# Patient Record
Sex: Female | Born: 1970 | Race: Asian | Hispanic: No | Marital: Married | Smoking: Never smoker
Health system: Southern US, Community
[De-identification: ages and names within clinical notes are randomized; demographics above are authoritative.]

## PROBLEM LIST (undated history)

## (undated) DIAGNOSIS — E119 Type 2 diabetes mellitus without complications: Secondary | ICD-10-CM

---

## 2000-10-30 ENCOUNTER — Other Ambulatory Visit: Admission: RE | Admit: 2000-10-30 | Discharge: 2000-10-30 | Payer: Self-pay | Admitting: Obstetrics and Gynecology

## 2001-04-23 ENCOUNTER — Inpatient Hospital Stay (HOSPITAL_COMMUNITY): Admission: AD | Admit: 2001-04-23 | Discharge: 2001-04-24 | Payer: Self-pay | Admitting: *Deleted

## 2004-08-01 ENCOUNTER — Other Ambulatory Visit: Admission: RE | Admit: 2004-08-01 | Discharge: 2004-08-01 | Payer: Self-pay | Admitting: Obstetrics and Gynecology

## 2004-08-29 ENCOUNTER — Encounter: Admission: RE | Admit: 2004-08-29 | Discharge: 2004-08-29 | Payer: Self-pay | Admitting: Obstetrics and Gynecology

## 2005-03-20 ENCOUNTER — Encounter: Admission: RE | Admit: 2005-03-20 | Discharge: 2005-04-08 | Payer: Self-pay | Admitting: Obstetrics and Gynecology

## 2005-04-11 ENCOUNTER — Ambulatory Visit (HOSPITAL_COMMUNITY): Admission: RE | Admit: 2005-04-11 | Discharge: 2005-04-11 | Payer: Self-pay | Admitting: Obstetrics and Gynecology

## 2005-04-14 ENCOUNTER — Inpatient Hospital Stay (HOSPITAL_COMMUNITY): Admission: AD | Admit: 2005-04-14 | Discharge: 2005-04-23 | Payer: Self-pay | Admitting: Obstetrics and Gynecology

## 2007-05-04 ENCOUNTER — Other Ambulatory Visit: Admission: RE | Admit: 2007-05-04 | Discharge: 2007-05-04 | Payer: Self-pay | Admitting: Family Medicine

## 2007-12-10 ENCOUNTER — Ambulatory Visit: Payer: Self-pay | Admitting: Family Medicine

## 2007-12-10 LAB — CONVERTED CEMR LAB
AST: 12 units/L (ref 0–37)
Alkaline Phosphatase: 74 units/L (ref 39–117)
Basophils Absolute: 0 10*3/uL (ref 0.0–0.1)
Basophils Relative: 0 % (ref 0–1)
CO2: 23 meq/L (ref 19–32)
Chloride: 107 meq/L (ref 96–112)
Creatinine, Ser: 0.48 mg/dL (ref 0.40–1.20)
Eosinophils Absolute: 0.2 10*3/uL (ref 0.0–0.7)
Glucose, Bld: 75 mg/dL (ref 70–99)
HCT: 37.2 % (ref 36.0–46.0)
Lymphocytes Relative: 27 % (ref 12–46)
Platelets: 269 10*3/uL (ref 150–400)
Potassium: 4 meq/L (ref 3.5–5.3)
RBC: 4.26 M/uL (ref 3.87–5.11)
Sed Rate: 30 mm/hr — ABNORMAL HIGH (ref 0–22)
Sodium: 140 meq/L (ref 135–145)
Total Bilirubin: 0.6 mg/dL (ref 0.3–1.2)
Vit D, 1,25-Dihydroxy: 16 — ABNORMAL LOW (ref 30–89)

## 2010-05-27 ENCOUNTER — Other Ambulatory Visit: Admission: RE | Admit: 2010-05-27 | Discharge: 2010-05-27 | Payer: Self-pay | Admitting: Family Medicine

## 2010-07-20 ENCOUNTER — Other Ambulatory Visit: Payer: Self-pay | Admitting: Family Medicine

## 2010-07-20 DIAGNOSIS — Z1231 Encounter for screening mammogram for malignant neoplasm of breast: Secondary | ICD-10-CM

## 2010-10-08 ENCOUNTER — Ambulatory Visit
Admission: RE | Admit: 2010-10-08 | Discharge: 2010-10-08 | Disposition: A | Discharge status: Home / Self Care | Payer: No Typology Code available for payment source | Source: Ambulatory Visit | Attending: Family Medicine | Admitting: Family Medicine

## 2010-10-08 DIAGNOSIS — Z1231 Encounter for screening mammogram for malignant neoplasm of breast: Secondary | ICD-10-CM

## 2010-11-15 NOTE — H&P (Signed)
[facility]  PatientSHAMERA, [name] Visit Number: [id number] MRN: [id number]          Service Type: Attending:  Naima A. Normand Sloop, M.D. Dictated by:   Nigel Bridgeman, C.N.M. Adm. Date:  04/23/01                           History and Physical  DATE OF BIRTH:                Apr 26, 1971  HISTORY OF PRESENT ILLNESS:   [name] is a 40 year old gravida 4, para 2-1-0-3 at 38-3/7 weeks who presents with uterine contractions since 6 a.m.  She denies any bleeding or leaking and reports positive fetal movement.  Pregnancy has been remarkable for:  1. History of rapid labor. 2. Language barrier with husband as Nurse, learning disability. 3. Previous preterm delivery at 36 weeks. 4. Conception while breast-feeding. 5. Skin rash of unknown etiology.  PRENATAL LABORATORY DATA:     Blood type is O+, Rh antibody negative, VDRL nonreactive, rubella titer positive, hepatitis B surface antigen negative, HIV nonreactive, sickle cell test negative, GC and chlamydia cultures were negative.  Pap was normal.  Initial glucose challenge was elevated at 140. Three-hour GTT had a three-hour value that was slightly elevated.  Hemoglobin upon entering the practice was 11.5.  It was 11.4 at 30 weeks.  EDC of May 03, 2001 was established by 25-week ultrasound secondary to conception on breast-feeding.  She had been scheduled for an ultrasound at 18 weeks but missed that appointment.  Plan had been made to repeat an ultrasound at 36 weeks; however, these results unknown.  HISTORY OF PRESENT PREGNANCY: The patient entered care at approximately 11 weeks.  TSH was done at her first visit secondary to hair loss.  She missed her 18-week ultrasound and had one at approximately 27 weeks, which showed questionable macrosomia.  Her one-hour Glucola was elevated.  Her three-hour GTT was slightly elevated on a three-hour value but no diagnosis of gestational diabetes was made.  She did have  some multiple skin lesions that were noted from the very beginning.  She was referred to Dr. Danella Deis.  PAST OBSTETRICAL HISTORY:     In 1995, she had a vaginal birth of a female infant, weight 7 pounds at 37 weeks.  She was in labor two hours.  This child was delivered in [place].  In 1997, she had a vaginal birth of a female infant, weight 7 pounds.  She was in labor approximately one hour.  She delivered at 38 weeks.  She had no complications.  This child was also delivered in [place].  In November 2000, she had a vaginal birth of a female infant, weight 6 pounds 8 ounces at 36 weeks.  She was in labor approximately one hour.  She had no complications.  That child was delivered in [place].  She did have severe nausea and vomiting with the previous pregnancy.  She had delivery at 36 weeks with her last pregnancy.  She also has occasional yeast infections during her pregnancies.  She reports the usual childhood illnesses and immunizations.  She does have occasional constipation.  Her only other hospitalization was for childbirth.  FAMILY HISTORY:               Unremarkable.  GENETIC HISTORY:              Unremarkable.  SOCIAL HISTORY:  The patient is married to the father of the baby.  He is involved and supportive.  His name is [employer].  The patient is of the Muslim faith.  She is from [place].  She speaks Urdu.  Her husband serves as an Equities trader.  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 Her husband has some college, Scientist, product/process development school, and is employed as an Retail banker.  She has been followed by the certified nurse midwife service at [facility].  She denies any alcohol, drug, or tobacco use during this pregnancy.  PHYSICAL EXAMINATION:  VITAL SIGNS:                  Stable.  The patient is afebrile.  HEENT:                        Within normal limits.  LUNGS:                        Breath sounds are clear.  HEART:                         Regular rate and rhythm without murmur.  BREASTS:                      Soft and nontender.  ABDOMEN:                      Fundal height is approximately 37 cm.  Estimated fetal weight is 7 to 7-1/2 pounds.  Uterine contractions are every 3-4 minutes, moderate quality.  Cervical exam 8 cm, 80%, vertex at a -1 station with bulging bag of water.  Fetal heart rate is reactive with no decelerations.  EXTREMITIES:                  Deep tendon reflexes are 2+ without clonus. There is a trace edema noted.  There are multiple scabbed lesions on her arms and legs.  IMPRESSION:                   1. Intrauterine pregnancy at 38-3/7 weeks.                               2. Active labor.                               3. History of rapid labor.                               4. Mild language barrier.                               5. Negative beta Strep.  PLAN:                         1. Admit to birthing suite for consult with                                  Dr. Jaymes Graff as attending physician.  2. Routine certified nurse midwife orders.                               3. Plan Stadol IV per patient request.                               4. Anticipate normal spontaneous vaginal birth. Dictated by:   Nigel Bridgeman, C.N.M. Attending:  Naima A. Normand Sloop, M.D. DD:  04/23/01 TD:  04/23/01 Job: [id number]

## 2010-11-15 NOTE — Discharge Summary (Signed)
[name], [name]                ACCOUNT NO.:  [id number]   MEDICAL RECORD NO.:  [id number]          PATIENT TYPE:  INP   LOCATION:  9122                          [facility]ILITY:  [facility]   PHYSICIAN:  Janine Limbo, M.D.DATE OF BIRTH:  1970-09-10   DATE OF ADMISSION:  04/14/2005  DATE OF DISCHARGE:  04/23/2005                                 DISCHARGE SUMMARY   ADMISSION DIAGNOSES:  1.  Intrauterine pregnancy at 34 weeks.  2.  Preterm premature rupture of membranes.  3.  Gestational diabetes.   DISCHARGE DIAGNOSES:  1.  Intrauterine pregnancy at 34 weeks.  2.  Preterm premature rupture of membranes.  3.  Gestational diabetes.  4.  Variable decelerations.  5.  Labor and vaginal delivery of a viable female infant named Darin Engels, Apgars      7 and 9 weighing 5 pounds 12 ounces.   HOSPITAL PROCEDURES:  1.  Electronic fetal monitoring.  2.  Diabetes management.  3.  Ultrasound.  4.  Betamethasone series.  5.  Epidural.  6.  Spontaneous vaginal delivery of a female infant named Ebrahim, Apgars 7      and 9 weighing 5 pounds 12 ounces.   HOSPITAL COURSE:  Patient was admitted with preterm premature rupture of  membranes at 34 weeks.  Expectant management was undertaken.  Cultures was  done.  Ultrasound was done showing a 34-week baby with an 8/8 BPP.  She was  placed on bed rest and diabetes management.  She did have some lability of  blood sugars related to steroid administration and this was controlled with  sliding scale insulin.  She developed some intermittent variable  decelerations on October 19 which resolved over the next few days.  She  continued to leak small amounts of clear fluid and then on October 23 she  went into labor and had a spontaneous vaginal delivery of a female infant  named Ebrahim with Apgars 7 and 9 weighing 5 pounds 12 ounces.  Baby was  well and was taken to the regular nursery.  Spontaneous expulsion of the  placenta was followed by an EBL of 300 mL and the  patient was taken to the  mother/baby unit where she did well except for some lability in her blood  sugars.  Fasting blood sugars have been 73-101 with two-hour p.c. blood  sugars ranging from 146-244.  Sliding scale insulin has been administered.  On postpartum day #2 she was doing well, ready to go home.  She was breast-  feeding her infant well and planning an IUD for contraception.  Vital signs  were stable, she was afebrile.  Capillary blood glucose fasting was 73.  Her  last two-hour p.c. blood sugar was 173.  She was treated with sliding scale  insulin.  Chest was clear.  Heart rate regular rate and rhythm.  Fundus was  firm.  Lochia small to moderate.  Perineum was healing.  Extremity within  normal limits and she was deemed to have received the full benefit of her  hospital stay, was discharged home.   DISCHARGE MEDICATIONS:  1.  Motrin 600 mg p.o. q.6 h. p.r.n.  2.  Insulin per sliding scale per M.D.   DISCHARGE LABORATORIES:  White blood cell count 13.6, hemoglobin 11.8,  platelets 306.   DISCHARGE INSTRUCTIONS:  Per [facility] handout and insulin management will be  determined by physician prior to discharge.   DISCHARGE FOLLOW-UP:  In four weeks for cultures and then two weeks later  for IUD.      [name], C.N.M.      Janine Limbo, M.D.  Electronically Signed    MLW/MEDQ  D:  04/23/2005  T:  04/23/2005  Job:  [id number]

## 2010-11-15 NOTE — H&P (Signed)
NAMEDHRUTI, GHUMAN                ACCOUNT NO.:  [id number]   MEDICAL RECORD NO.:  [id number]          PATIENT TYPE:  INP   LOCATION:  9155                          [facility]  [facility]   PHYSICIAN:  Janine Limbo, M.D.DATE OF BIRTH:  Mar 15, 1971   DATE OF ADMISSION:  04/14/2005  DATE OF DISCHARGE:                                HISTORY & PHYSICAL   HISTORY OF PRESENT ILLNESS:  Mrs. Noviello is a 40 year old female, gravida 5,  para 4-0-0-4, at 34-0/7 weeks who presents with rupture of membranes at 10  p.m. tonight.  She denies contractions, denies bleeding. She has had some  back pain.  Her pregnancy has been followed by the [facility]  M.D. service and is remarkable for :  1.  Gestational diabetes mellitus.  2.  Fetal left pyelectasia.  3.  Prefers female M.D.   LABORATORY DATA:  Labs were collected on Nov 17, 2004.  Hemoglobin 11.9,  hematocrit 35, platelets 301,000, blood type O positive, antibody negative.  Sickle cell trait negative.  RPR nonreactive.  Rubella immune.  Hepatitis B  surface antigen negative. HIV nonreactive.  Diarrhea negative.  Chlamydia  negative.  Cystic fibrosis negative.  Quad screen from January 17, 2005 was  within normal limits.  One-hour Glucola from February 26, 2005 was elevated.  Three-hour glucose tolerance test from February 25, 2005 was elevated.   HISTORY OF PRESENT PREGNANCY:  The patient presented for care at [facility] on Nov 27, 2004 at 14-1/7 weeks.  Ultrasonography from December 24, 2004 showed growth consistent with previous dating confirming [facility] of  May 25, 2005.  There was bilateral renal pyelectasis noted.  The  patient declined quad screen at that point.  When she was 22 weeks, she  decided to have quad screen which was within normal limits.  The patient had  an elevated dextrose stick at [redacted] weeks gestation and, therefore, was  scheduled for three-hour glucose tolerance test which was consistent with  gestational diabetes.   Ultrasonography at [redacted] weeks gestation showed AFW at  the 50th to 75th percentile.  The patient was started on insulin 4 units  Regular in the morning and at night.  Ultrasonography on April 11, 2005  showed estimated fetal weight in the 75th to 90th percentile with normal  fluid.  Cervix 4 cm in thickness.  She was consistent with her antenatal  testing.  The rest of her prenatal care has been unremarkable.  Her   OB HISTORY:  She is gravida 5, para 4-0-0-4.  In November 1995, she had a  vaginal delivery of the female infant weighing 7 pounds at 37 weeks after  two hours in labor.  She had no anesthesia.  No complications.  That infant  was born in [place].  In January 1997 she had a vaginal delivery of a female  infant weighing 7 pounds at 38 weeks after a one-hour labor.  She had no  anesthesia  and no complications with that pregnancy or birth.  That infant  was also born in [place].  In November 2000, she had  a female infant at 36  weeks weighing 6 pounds and 7 ounces after a one-hour labor, born vaginally.  She had no complications.  That infant was born in [place] in October 2002.  She had a female infant vaginally weighing 6 pounds 12 ounces after a one-  hour labor.  She had no anesthesia, no complications.  That infant [facility] born  at [facility].   PAST MEDICAL HISTORY:  She has no medication allergies.  She experienced  menarche at the age of 71 with 28-30-day cycles lasting five days.  She has  history of yeast infection.  Reports having had the usual childhood  illnesses.  Only medical problem is constipation.   PAST SURGICAL HISTORY:  Negative.   GENETIC HISTORY:  Negative.   SOCIAL HISTORY:  The patient is married to the father of the baby.  His name  is Sirha.  He is involved and supportive.  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  The father of the baby has 15 years of  education and is employed full-time as an Retail banker.  They deny  any  alcohol, tobacco, or illicit drug use since the pregnancy.   PHYSICAL EXAMINATION:  VITAL SIGNS:  Stable.  She is afebrile.  HEENT:  Grossly within normal limits.  CHEST:  Clear to auscultation.  HEART:  Regular rate and rhythm.  ABDOMEN:  Gravid in contour.  Fundal height extending approximately 34 cm by  pubic symphysis.  Fetal heart rate is reactive and reassuring.  There are  rare contractions.  They are mild.  PELVIC:  Remarkable for copious clear fluid, positive Nitrazine, positive  for ferning.  Cervix appears closed.  Bedside ultrasound confirms vertex  presentation.  EXTREMITIES:  Normal.   LABORATORY DATA:  Clean catch urinalysis is negative.   ASSESSMENT:  1.  Intrauterine pregnancy at 34 weeks.  2.  Preterm premature rupture of membranes.   PLAN:  1.  Admit to antenatal unit.  2.  Group B strep, gonorrhea and Chlamydia cultures were collected and urine      culture was sent.  3.  Plan expectant management.      Cam Hai, C.N.M.      Janine Limbo, M.D.  Electronically Signed    KS/MEDQ  D:  04/15/2005  T:  04/15/2005  Job:  [id number]

## 2011-11-27 ENCOUNTER — Other Ambulatory Visit: Payer: Self-pay

## 2011-11-27 ENCOUNTER — Other Ambulatory Visit: Payer: Self-pay | Admitting: Obstetrics and Gynecology

## 2011-11-27 ENCOUNTER — Other Ambulatory Visit (HOSPITAL_COMMUNITY)
Admission: RE | Admit: 2011-11-27 | Discharge: 2011-11-27 | Disposition: A | Discharge status: Home / Self Care | Payer: Medicaid Other | Source: Ambulatory Visit | Attending: Obstetrics and Gynecology | Admitting: Obstetrics and Gynecology

## 2011-11-27 DIAGNOSIS — Z3480 Encounter for supervision of other normal pregnancy, unspecified trimester: Secondary | ICD-10-CM | POA: Insufficient documentation

## 2011-11-27 DIAGNOSIS — Z113 Encounter for screening for infections with a predominantly sexual mode of transmission: Secondary | ICD-10-CM | POA: Insufficient documentation

## 2011-11-27 DIAGNOSIS — Z124 Encounter for screening for malignant neoplasm of cervix: Secondary | ICD-10-CM | POA: Insufficient documentation

## 2011-12-01 ENCOUNTER — Other Ambulatory Visit: Payer: Self-pay | Admitting: Obstetrics and Gynecology

## 2011-12-01 ENCOUNTER — Ambulatory Visit (HOSPITAL_COMMUNITY)
Admission: RE | Admit: 2011-12-01 | Discharge: 2011-12-01 | Disposition: A | Discharge status: Home / Self Care | Payer: Medicaid Other | Source: Ambulatory Visit | Attending: Obstetrics and Gynecology | Admitting: Obstetrics and Gynecology

## 2011-12-01 ENCOUNTER — Encounter (HOSPITAL_COMMUNITY): Payer: Self-pay

## 2011-12-01 VITALS — BP 107/66 | HR 85 | Wt 119.0 lb

## 2011-12-01 DIAGNOSIS — O24919 Unspecified diabetes mellitus in pregnancy, unspecified trimester: Secondary | ICD-10-CM

## 2011-12-01 DIAGNOSIS — O09529 Supervision of elderly multigravida, unspecified trimester: Secondary | ICD-10-CM

## 2011-12-01 NOTE — Progress Notes (Signed)
MFM note  [name] is a 41 yo [id number] currently at 68 2/7 weeks by dates, EDD 26 November who is referred due to hx of type II diabetes on Insulin.  She reports that she was given the diagnosis of diabetes 2 years ago and denies any know end-organ disease.  She has not had a recent HbA1C value.  She was seen by an optometry about 3 months ago and has no evidence of retinopathy.  She denies thyroid abnormalities.  She is currently on Lantus 20 units BID and "sliding scale" Humolog at each meal.  In general, she takes 10 units with each meal.  She has a glucometer, but admits to checking her sugars infrequently.  Her glucometer reports a 14 day average of 210 and a 30 day average of 183.  POB hx: preterm SVD at 36 weeks x 1; Term SVD x 4 without complications  PMH - as above  PSH - neg  Social - ETOH neg  Tob - neg  Impression/ Plan: Type II diabetes on insulin - poorly compliant  Based on her infrequent fingerstick data, I am hesitant to make any changes to her current insulin regimen.  Fingerstick logs given to the patient.  Recommend checking fingersticks 4 x daily (fasting, 1 hr post prandial since on Humolog) Scheduled patient to see our diabetic education in 2 weeks- but will attempt to see her next week if possible.  Recommend: - Check HbA1C - this will assist in counseling regarding risks for fetal anomalies - 24 hr urine protein and Creat Cl if not already performed - Detailed MFM ultrasound at 18 weeks (tentatively scheduled) - Fetal echo at 20-22 weeks with Peds cardiology  Thank you for this referral.  I spent approximately 30 minutes with this patient with over 50% of time spent in face-to-face counseling.

## 2011-12-18 ENCOUNTER — Other Ambulatory Visit: Payer: Self-pay | Admitting: Obstetrics and Gynecology

## 2011-12-18 ENCOUNTER — Encounter: Payer: Medicaid Other | Attending: Obstetrics and Gynecology | Admitting: Dietician

## 2011-12-18 ENCOUNTER — Ambulatory Visit (HOSPITAL_COMMUNITY)
Admission: RE | Admit: 2011-12-18 | Discharge: 2011-12-18 | Disposition: A | Discharge status: Home / Self Care | Payer: Medicaid Other | Source: Ambulatory Visit | Attending: Obstetrics and Gynecology | Admitting: Obstetrics and Gynecology

## 2011-12-18 DIAGNOSIS — O99810 Abnormal glucose complicating pregnancy: Secondary | ICD-10-CM | POA: Insufficient documentation

## 2011-12-18 DIAGNOSIS — Z713 Dietary counseling and surveillance: Secondary | ICD-10-CM | POA: Insufficient documentation

## 2011-12-18 NOTE — ED Notes (Addendum)
Diabetes Education:  41 year old G6 P5 presents for review of the carb restricted diet in pregnancy.  Gives history of never having glucose issues until her last pregnancy in 2006 when she had GDM.  Following her delivery in 2006, she had no glucose issues until 2010, she was initially diagnosed with type 2 diabetes and found that oral medications did not control her glucose levels and she was started on Lantus and Novolog insulins.  Currently she is taking Lantus 20 units at HS and is using a sliding scale for her mealtime Novolog coverage.  Her glucose levels are consistently running 170-270 mg or greater.  She a couple fasting levels over the last 2 weeks that were 120 or less.  She had an episode on Tuesday where her glucose was 347 before lunch, she took the appropriate insulin dose pre her sliding scale and became sleepy, ate no lunch, layed down and slept for the afternoon, awoke at dinner time being shaky and sweaty with a glucose levels of 67 mg.  She has not been following a diet for limiting carbs.  She is eating 3 large meals each day and "stays hungry all the time."   We completed a review of the dietary recommendations.  She reported she could read [place] and I provided her with the handouts:Nutrition, Diabetes, and Pregnancy and a Carbohydrate Counting Guide.   She is now receiving [place] Medicaid, and has a True Tack glucose meter.  I provided her with an Accu-Chek SmartView Nano Meter Kit, and reviewed the testing procedure for this meter. Following review of glucose testing, a return demonstration produced a random late afternoon blood glucose of 267 (technically it would be a pre-dinner blood glucose.  Provided an Accu-Chek SmartView meter Kit, [id number] EXP: 03/29/2013. Given that many pharmacies do not have the strips and lancets for this model of Accu-Chek, Dr. Sherrie George provided prescriptions to cover strips and lancets for a new meter. She has my card and is to call or e-mail with questions.   She will return with her blood glucose readings when she returns for a follow-up appointment at [facility] on 6/26.  Maggie Cobi Delph, RN, RD, CDE

## 2011-12-25 ENCOUNTER — Other Ambulatory Visit: Payer: Self-pay

## 2011-12-25 ENCOUNTER — Encounter (HOSPITAL_COMMUNITY): Payer: Self-pay

## 2011-12-25 ENCOUNTER — Ambulatory Visit (HOSPITAL_COMMUNITY)
Admission: RE | Admit: 2011-12-25 | Discharge: 2011-12-25 | Disposition: A | Discharge status: Home / Self Care | Payer: Medicaid Other | Source: Ambulatory Visit | Attending: Obstetrics and Gynecology | Admitting: Obstetrics and Gynecology

## 2011-12-25 VITALS — BP 106/67 | HR 89 | Wt 123.0 lb

## 2011-12-25 DIAGNOSIS — Z363 Encounter for antenatal screening for malformations: Secondary | ICD-10-CM | POA: Insufficient documentation

## 2011-12-25 DIAGNOSIS — O358XX0 Maternal care for other (suspected) fetal abnormality and damage, not applicable or unspecified: Secondary | ICD-10-CM | POA: Insufficient documentation

## 2011-12-25 DIAGNOSIS — Z1389 Encounter for screening for other disorder: Secondary | ICD-10-CM | POA: Insufficient documentation

## 2011-12-25 DIAGNOSIS — IMO0002 Reserved for concepts with insufficient information to code with codable children: Secondary | ICD-10-CM

## 2011-12-25 DIAGNOSIS — O09529 Supervision of elderly multigravida, unspecified trimester: Secondary | ICD-10-CM | POA: Insufficient documentation

## 2011-12-25 DIAGNOSIS — Z8751 Personal history of pre-term labor: Secondary | ICD-10-CM | POA: Insufficient documentation

## 2011-12-25 DIAGNOSIS — O24919 Unspecified diabetes mellitus in pregnancy, unspecified trimester: Secondary | ICD-10-CM | POA: Insufficient documentation

## 2011-12-25 HISTORY — DX: Type 2 diabetes mellitus without complications: E11.9

## 2011-12-25 LAB — OB RESULTS CONSOLE HIV ANTIBODY (ROUTINE TESTING): HIV: NONREACTIVE

## 2011-12-25 LAB — OB RESULTS CONSOLE ABO/RH: RH Type: POSITIVE

## 2011-12-25 LAB — OB RESULTS CONSOLE RUBELLA ANTIBODY, IGM: Rubella: IMMUNE

## 2011-12-25 NOTE — Progress Notes (Signed)
Obstetric ultrasound performed today.   IUP at 18 weeks 1 day Fetal measurements consistent with dating by LMP Normal amniotic fluid volume Normal fetal anatomic survey (some limited cardiac views) No markers of fetal aneuploidy identified Marginal placental cord insertion  Advanced maternal age was discussed with the patient.  She stated a desire for genetic counseling but would like to return with her husband for this appointment.  She declined any testing for fetal aneuploidy today but stated that she may have questions to review at her follow up visit.   Patient's blood sugar log was reviewed today.  Most values were significantly elevated.  Patient was advised to increase her Lantus from 20 untis BID to 26 units BID.  She was also advised to check several  3AM blood sugars and to continue to keep a log.  A recent HgA1C was not able to be located today.  Questions were answered and precautions given.   Follow up visit with diabetes education scheduled 01/08/12.  Follow up ultrasound and genetic counseling scheduled in 4 weeks.  Patient declined a visit with genetic counseling sooner.  Fetal echo also scheduled.    Please see [facility] for full report.

## 2012-01-08 ENCOUNTER — Ambulatory Visit (HOSPITAL_COMMUNITY): Payer: Medicaid Other

## 2012-01-08 ENCOUNTER — Encounter: Payer: Medicaid Other | Attending: Obstetrics and Gynecology | Admitting: Dietician

## 2012-01-08 ENCOUNTER — Ambulatory Visit (HOSPITAL_COMMUNITY)
Admission: RE | Admit: 2012-01-08 | Discharge: 2012-01-08 | Disposition: A | Discharge status: Home / Self Care | Payer: Medicaid Other | Source: Ambulatory Visit | Attending: Obstetrics and Gynecology | Admitting: Obstetrics and Gynecology

## 2012-01-08 DIAGNOSIS — O99810 Abnormal glucose complicating pregnancy: Secondary | ICD-10-CM | POA: Insufficient documentation

## 2012-01-08 DIAGNOSIS — Z713 Dietary counseling and surveillance: Secondary | ICD-10-CM | POA: Insufficient documentation

## 2012-01-08 NOTE — ED Notes (Signed)
01/08/2012 Diabetes Education:  Comes into today and reports that she is not checking her blood glucose levels as instructed because the pharmacy would only give her one canister of monitoring strips.  The pharmacist told her her MD needed to write a prescription and state that she needed to monitor more frequently and why.  Medicaid will only pay for 1 canister of 50 strips per month.  She reports she was unable to get the MD in the [facility] office to write the prescription; so she has decreased her testing to 2 times per day.  She also was unable to get a prescription for the Lantus and Novolog written.  She has cut her insulin dose to "make it last"  She has prescribed Lantus 26 units in the evening and 26 units in the AM.  She currently is using 20 uints.  Her Novolog is by a sliding scale which she never has with her and does not remember.  Review of her blood glucose numbers in her meter reveal the lowest number at 119 and many in the 150-200-300-as high as 515 mg.  Today, she c/o thirst and took 2 12 oz cups of water during the visit.  She c/o burning and pain in her feet.  Her glucose at 4:50 PM was 253 mg. I was concerned, consulted with Dr. Claudean Severance and call and consulted with Dr. Chilton Si and Dr. Claudean Severance spoke with Dr. Chilton Si.  As I understand the situation, Dr Chilton Si plans to see her tomorrow, and to possibly admit [name] to the hospital to stabilize her blood glucose.  Maggie [place]ound Katich, RN, RD, CDE

## 2012-01-13 ENCOUNTER — Encounter: Payer: Self-pay | Admitting: Obstetrics and Gynecology

## 2012-01-15 ENCOUNTER — Ambulatory Visit (HOSPITAL_COMMUNITY): Payer: Medicaid Other

## 2012-01-23 ENCOUNTER — Ambulatory Visit (HOSPITAL_COMMUNITY)
Admission: RE | Admit: 2012-01-23 | Discharge: 2012-01-23 | Disposition: A | Discharge status: Home / Self Care | Payer: Medicaid Other | Source: Ambulatory Visit | Attending: Obstetrics and Gynecology | Admitting: Obstetrics and Gynecology

## 2012-01-23 DIAGNOSIS — O09529 Supervision of elderly multigravida, unspecified trimester: Secondary | ICD-10-CM | POA: Insufficient documentation

## 2012-01-23 DIAGNOSIS — Z8751 Personal history of pre-term labor: Secondary | ICD-10-CM | POA: Insufficient documentation

## 2012-01-23 DIAGNOSIS — Z1389 Encounter for screening for other disorder: Secondary | ICD-10-CM | POA: Insufficient documentation

## 2012-01-23 DIAGNOSIS — Z363 Encounter for antenatal screening for malformations: Secondary | ICD-10-CM | POA: Insufficient documentation

## 2012-01-23 DIAGNOSIS — O24919 Unspecified diabetes mellitus in pregnancy, unspecified trimester: Secondary | ICD-10-CM

## 2012-01-23 DIAGNOSIS — IMO0002 Reserved for concepts with insufficient information to code with codable children: Secondary | ICD-10-CM

## 2012-01-23 DIAGNOSIS — O358XX0 Maternal care for other (suspected) fetal abnormality and damage, not applicable or unspecified: Secondary | ICD-10-CM | POA: Insufficient documentation

## 2012-01-23 NOTE — Progress Notes (Addendum)
Genetic Counseling  High-Risk Gestation Note  Appointment Date:  01/23/2012 Referred By: Fortino Sic, MD Date of Birth:  May 14, 1971    Pregnancy History: [id number] Estimated Date of Delivery: 05/26/12 Estimated Gestational Age: [redacted]w[redacted]d Attending: Rema Fendt, MD   Mrs. Paschal Dopp and her husband, Mr. Craig Staggers, were seen for genetic counseling because of a maternal age of 41 y.o.Marland Kitchen     They were counseled regarding maternal age and the association with risk for chromosome conditions due to nondisjunction with aging of the ova.   We reviewed chromosomes, nondisjunction, and the associated 1 in 71 risk for fetal aneuploidy related to a maternal age of 41 y.o. at [redacted]w[redacted]d gestation.  They were counseled that the risk for aneuploidy decreases as gestational age increases, accounting for those pregnancies which spontaneously abort.  We specifically discussed Down syndrome (trisomy 21), trisomies 13 and 48, including the common features and prognoses of each.   We reviewed other available screening options including noninvasive prenatal testing (NIPT), and detailed ultrasound. They understand that screening tests are used to modify a patient's a priori risk for aneuploidy, typically based on age.  This estimate provides a pregnancy specific risk assessment.  Specifically, we discussed that NIPT analyzes cell free fetal DNA found in the maternal circulation. This test is not diagnostic for chromosome conditions, but can provide information regarding the presence or absence of extra fetal DNA for chromosomes 13, 18, 21, X, and Y, and missing fetal DNA for chromosome X and Y (Turner syndrome). Thus, it would not identify or rule out all genetic conditions. The reported detection rate is greater than 99% for Trisomy 21, greater than 98% for Trisomy 18, and is approximately 80% (8 out of 10) for Trisomy 13. The false positive rate is reported to be less than 0.1% for any of these conditions.  In addition, we  discussed that ~50-80% of fetuses with Down syndrome and up to 90-95% of fetuses with trisomy 18/13, when well visualized, have detectable anomalies or soft markers by detailed ultrasound (~18+ weeks gestation). We reviewed that Ms. Loleta Clinch's ultrasound performed on 12/25/11 did not visualized markers of aneuploidy at that time.   They were also counseled regarding diagnostic testing via amniocentesis.  We reviewed the approximate 1 in 300-500 risk for complications for amniocentesis, including spontaneous pregnancy loss or preterm labor and delivery at this late gestational age. We discussed the risks, limitations, and benefits of each screening and testing option. After consideration of all the options, they elected to proceed with ultrasound only and declined additional screening and testing for aneuploidy, including NIPT and amniocentesis.  A complete ultrasound was performed today.  The ultrasound report will be sent under separate cover. She understands that ultrasound cannot rule out all birth defects or genetic syndromes. The patient was advised of this limitation and states she still does not want diagnostic testing at this time.   Both family histories were reviewed and found to be noncontributory for birth defects, mental retardation, and known genetic conditions. Without further information regarding the provided family history, an accurate genetic risk cannot be calculated. Further genetic counseling is warranted if more information is obtained. The father of the pregnancy reported that he is 41 years old. This couple was counseled that advanced paternal age (APA) is defined as paternal age greater than or equal to age 55.  Recent large-scale sequencing studies have shown that approximately 80% of de novo point mutations are of paternal origin.  Many studies have demonstrated a  strong correlation between increased paternal age and de novo point mutations.  Although no specific data is available  regarding fetal risks for fathers 62+ years old at conception, it is apparent that the overall risk for single gene conditions is increased.  To estimate the relative increase in risk of a genetic disorder with APA, the heritability of the disease must be considered.  Assuming an approximate 2x increase in risk for conditions that are exclusively paternal in origin, the risk for each individual condition is still relatively low.  It is estimated that the overall chance for a de novo mutation is ~0.5%.  We also discussed the wide range of conditions which can be caused by new dominant gene mutations (achondroplasia, neurofibromatosis, Marfan syndrome etc.).  They were counseled that genetic testing for each individual single gene condition is not warranted or available unless ultrasound or family history concerns lend suspicion to a specific condition.    Mrs. Ivey Delman denied exposure to environmental toxins or chemical agents. She denied the use of alcohol, tobacco or street drugs. She denied significant viral illnesses during the course of her pregnancy. Her medical and surgical histories were contributory for diabetes, for which she is currently taking Novolog and Lantus. She was previously seen for MFM consultation and Diabetic Education to discuss diabetic management. She has a follow-up diabetic counseling appointment scheduled on 01/29/12. See separate notes for detailed discussion regarding diabetic management.   I counseled this couple regarding the above risks and available options.  The approximate face-to-face time with the genetic counselor was 25 minutes.  Quinn Plowman, MS,  Certified [employer] 01/23/2012

## 2012-01-23 NOTE — Progress Notes (Signed)
[name] was seen for ultrasound appointment today.  Please see [facility]-OBGYN report for details.

## 2012-01-29 ENCOUNTER — Ambulatory Visit (HOSPITAL_COMMUNITY)
Admission: RE | Admit: 2012-01-29 | Discharge: 2012-01-29 | Disposition: A | Discharge status: Home / Self Care | Payer: Medicaid Other | Source: Ambulatory Visit | Attending: Obstetrics and Gynecology | Admitting: Obstetrics and Gynecology

## 2012-01-29 ENCOUNTER — Other Ambulatory Visit: Payer: Self-pay | Admitting: Obstetrics and Gynecology

## 2012-01-29 ENCOUNTER — Encounter: Payer: Medicaid Other | Attending: Obstetrics and Gynecology | Admitting: Dietician

## 2012-01-29 DIAGNOSIS — O99810 Abnormal glucose complicating pregnancy: Secondary | ICD-10-CM | POA: Insufficient documentation

## 2012-01-29 DIAGNOSIS — Z713 Dietary counseling and surveillance: Secondary | ICD-10-CM | POA: Insufficient documentation

## 2012-01-29 NOTE — ED Notes (Signed)
Diabetes Education:  01/29/2012  Comes in today with report of better blood glucose levels.  Dr. Chilton Si referred he to Dr. Allena Katz the endocrinologist at [facility].  She saw Catalina Lunger the Pa on 01/26/2012.  Ms Yetta Barre has given her a new sliding scale and will be seeing her weekly for blood glucose control.  Fasting levels: 157, 97, 111, 124  Before lunch: 111, 140, 111, 139.  Before Dinner (which is late.  She is observing Ramadan) : 203, 204.   She continues on the Lantus and the Novolog insulins.  WT: 130.8 lb a gain of 5.04 lb since her appointment on 01/08/2012.  She will return to [facility] for ultrasound.  Maggie Delmar Arriaga, RN, RD, CDE

## 2012-02-20 ENCOUNTER — Encounter (HOSPITAL_COMMUNITY): Payer: Self-pay

## 2012-02-20 ENCOUNTER — Ambulatory Visit (HOSPITAL_COMMUNITY)
Admission: RE | Admit: 2012-02-20 | Discharge: 2012-02-20 | Disposition: A | Discharge status: Home / Self Care | Payer: Medicaid Other | Source: Ambulatory Visit | Attending: Obstetrics and Gynecology | Admitting: Obstetrics and Gynecology

## 2012-02-20 VITALS — BP 102/64 | HR 93 | Wt 136.2 lb

## 2012-02-20 DIAGNOSIS — O09529 Supervision of elderly multigravida, unspecified trimester: Secondary | ICD-10-CM | POA: Insufficient documentation

## 2012-02-20 DIAGNOSIS — IMO0002 Reserved for concepts with insufficient information to code with codable children: Secondary | ICD-10-CM

## 2012-02-20 DIAGNOSIS — Z8751 Personal history of pre-term labor: Secondary | ICD-10-CM | POA: Insufficient documentation

## 2012-02-20 DIAGNOSIS — O24919 Unspecified diabetes mellitus in pregnancy, unspecified trimester: Secondary | ICD-10-CM

## 2012-02-27 ENCOUNTER — Other Ambulatory Visit: Payer: Self-pay

## 2012-02-27 ENCOUNTER — Inpatient Hospital Stay (HOSPITAL_COMMUNITY): Payer: Medicaid Other

## 2012-02-27 ENCOUNTER — Encounter (HOSPITAL_COMMUNITY): Payer: Self-pay | Admitting: Family

## 2012-02-27 ENCOUNTER — Inpatient Hospital Stay (HOSPITAL_COMMUNITY)
Admission: AD | Admit: 2012-02-27 | Discharge: 2012-03-08 | DRG: 765 | Disposition: A | Discharge status: Home / Self Care | Payer: Medicaid Other | Source: Ambulatory Visit | Attending: Obstetrics and Gynecology | Admitting: Obstetrics and Gynecology

## 2012-02-27 DIAGNOSIS — D649 Anemia, unspecified: Secondary | ICD-10-CM | POA: Diagnosis present

## 2012-02-27 DIAGNOSIS — O4690 Antepartum hemorrhage, unspecified, unspecified trimester: Secondary | ICD-10-CM | POA: Diagnosis present

## 2012-02-27 DIAGNOSIS — O99892 Other specified diseases and conditions complicating childbirth: Secondary | ICD-10-CM | POA: Diagnosis present

## 2012-02-27 DIAGNOSIS — O9902 Anemia complicating childbirth: Secondary | ICD-10-CM | POA: Diagnosis present

## 2012-02-27 DIAGNOSIS — Z98891 History of uterine scar from previous surgery: Secondary | ICD-10-CM

## 2012-02-27 DIAGNOSIS — O2432 Unspecified pre-existing diabetes mellitus in childbirth: Secondary | ICD-10-CM | POA: Diagnosis present

## 2012-02-27 DIAGNOSIS — O09529 Supervision of elderly multigravida, unspecified trimester: Secondary | ICD-10-CM | POA: Diagnosis present

## 2012-02-27 DIAGNOSIS — E119 Type 2 diabetes mellitus without complications: Secondary | ICD-10-CM | POA: Diagnosis present

## 2012-02-27 DIAGNOSIS — Z22330 Carrier of Group B streptococcus: Secondary | ICD-10-CM

## 2012-02-27 DIAGNOSIS — O42919 Preterm premature rupture of membranes, unspecified as to length of time between rupture and onset of labor, unspecified trimester: Secondary | ICD-10-CM

## 2012-02-27 DIAGNOSIS — O4290 Premature rupture of membranes, unspecified as to length of time between rupture and onset of labor, unspecified weeks of gestation: Principal | ICD-10-CM | POA: Diagnosis present

## 2012-02-27 DIAGNOSIS — O322XX0 Maternal care for transverse and oblique lie, not applicable or unspecified: Secondary | ICD-10-CM | POA: Diagnosis present

## 2012-02-27 LAB — URINALYSIS, ROUTINE W REFLEX MICROSCOPIC
Bilirubin Urine: NEGATIVE
Ketones, ur: NEGATIVE mg/dL
Leukocytes, UA: NEGATIVE
Nitrite: NEGATIVE
Specific Gravity, Urine: 1.015 (ref 1.005–1.030)
Urobilinogen, UA: 0.2 mg/dL (ref 0.0–1.0)
pH: 7.5 (ref 5.0–8.0)

## 2012-02-27 LAB — GROUP B STREP BY PCR: Group B strep by PCR: POSITIVE — AB

## 2012-02-27 LAB — CBC WITH DIFFERENTIAL/PLATELET
Basophils Absolute: 0 10*3/uL (ref 0.0–0.1)
HCT: 30.4 % — ABNORMAL LOW (ref 36.0–46.0)
Hemoglobin: 10.1 g/dL — ABNORMAL LOW (ref 12.0–15.0)
Lymphs Abs: 2.8 10*3/uL (ref 0.7–4.0)
MCH: 27.4 pg (ref 26.0–34.0)
MCHC: 33.2 g/dL (ref 30.0–36.0)
MCV: 82.4 fL (ref 78.0–100.0)
Neutro Abs: 7 10*3/uL (ref 1.7–7.7)
RBC: 3.69 MIL/uL — ABNORMAL LOW (ref 3.87–5.11)

## 2012-02-27 LAB — GLUCOSE, CAPILLARY
Glucose-Capillary: 53 mg/dL — ABNORMAL LOW (ref 70–99)
Glucose-Capillary: 261 mg/dL — ABNORMAL HIGH (ref 70–99)

## 2012-02-27 LAB — RPR: RPR Ser Ql: NONREACTIVE

## 2012-02-27 LAB — LSPG (L/S RATIO WITH PG)-AMNIO FLUID

## 2012-02-27 MED ORDER — PRENATAL MULTIVITAMIN CH
1.0000 | ORAL_TABLET | Freq: Every day | ORAL | Status: DC
  Administered 2012-02-27 – 2012-03-04 (×7): 1 via ORAL
  Filled 2012-02-27 (×9): qty 1

## 2012-02-27 MED ORDER — AZITHROMYCIN 500 MG PO TABS
500.0000 mg | ORAL_TABLET | Freq: Every day | ORAL | Status: AC
  Administered 2012-02-27 – 2012-03-04 (×7): 500 mg via ORAL
  Filled 2012-02-27 (×5): qty 2
  Filled 2012-02-27: qty 1
  Filled 2012-02-27: qty 2

## 2012-02-27 MED ORDER — ACETAMINOPHEN 325 MG PO TABS
650.0000 mg | ORAL_TABLET | ORAL | Status: DC | PRN
  Administered 2012-02-27 – 2012-03-01 (×5): 650 mg via ORAL
  Filled 2012-02-27 (×5): qty 2

## 2012-02-27 MED ORDER — GLUCOSE-VITAMIN C 4-6 GM-MG PO CHEW: 4.0000 | CHEWABLE_TABLET | ORAL | Status: DC | PRN

## 2012-02-27 MED ORDER — BETAMETHASONE SOD PHOS & ACET 6 (3-3) MG/ML IJ SUSP
12.0000 mg | Freq: Once | INTRAMUSCULAR | Status: AC
  Administered 2012-02-27: 12 mg via INTRAMUSCULAR
  Filled 2012-02-27: qty 2

## 2012-02-27 MED ORDER — DEXTROSE 50 % IV SOLN
INTRAVENOUS | Status: AC
  Filled 2012-02-27: qty 50

## 2012-02-27 MED ORDER — GLUCOSE-VITAMIN C 4-6 GM-MG PO CHEW
CHEWABLE_TABLET | ORAL | Status: AC
  Administered 2012-02-27: 16 g
  Filled 2012-02-27: qty 1

## 2012-02-27 MED ORDER — INSULIN ASPART 100 UNIT/ML ~~LOC~~ SOLN: 0.0000 [IU] | SUBCUTANEOUS | Status: DC

## 2012-02-27 MED ORDER — CALCIUM CARBONATE ANTACID 500 MG PO CHEW
2.0000 | CHEWABLE_TABLET | ORAL | Status: DC | PRN
  Administered 2012-03-01: 400 mg via ORAL
  Filled 2012-02-27: qty 2

## 2012-02-27 MED ORDER — FERROUS SULFATE 300 (60 FE) MG/5ML PO SYRP
300.0000 mg | ORAL_SOLUTION | Freq: Every day | ORAL | Status: DC
  Administered 2012-02-28 – 2012-03-02 (×4): 300 mg via ORAL
  Filled 2012-02-27 (×4): qty 5

## 2012-02-27 MED ORDER — LACTATED RINGERS IV SOLN
INTRAVENOUS | Status: DC
  Administered 2012-02-27 – 2012-03-04 (×18): via INTRAVENOUS

## 2012-02-27 MED ORDER — BETAMETHASONE SOD PHOS & ACET 6 (3-3) MG/ML IJ SUSP
12.0000 mg | INTRAMUSCULAR | Status: AC
  Administered 2012-02-28: 12 mg via INTRAMUSCULAR
  Filled 2012-02-27: qty 2

## 2012-02-27 MED ORDER — INSULIN GLARGINE 100 UNIT/ML ~~LOC~~ SOLN
36.0000 [IU] | Freq: Every day | SUBCUTANEOUS | Status: DC
  Administered 2012-02-27 – 2012-03-04 (×7): 36 [IU] via SUBCUTANEOUS
  Filled 2012-02-27: qty 10

## 2012-02-27 MED ORDER — INSULIN ASPART 100 UNIT/ML ~~LOC~~ SOLN
0.0000 [IU] | Freq: Three times a day (TID) | SUBCUTANEOUS | Status: DC
  Administered 2012-02-27: 8 [IU] via SUBCUTANEOUS
  Administered 2012-02-27: 6 [IU] via SUBCUTANEOUS
  Administered 2012-02-28: 2 [IU] via SUBCUTANEOUS
  Administered 2012-02-28: 1 [IU] via SUBCUTANEOUS
  Administered 2012-02-28: 6 [IU] via SUBCUTANEOUS
  Administered 2012-02-28: 18 [IU] via SUBCUTANEOUS

## 2012-02-27 MED ORDER — INSULIN GLARGINE 100 UNIT/ML ~~LOC~~ SOLN
30.0000 [IU] | SUBCUTANEOUS | Status: DC
  Administered 2012-02-28 – 2012-03-04 (×6): 30 [IU] via SUBCUTANEOUS

## 2012-02-27 MED ORDER — SODIUM CHLORIDE 0.9 % IV SOLN
2.0000 g | Freq: Four times a day (QID) | INTRAVENOUS | Status: DC
  Administered 2012-02-27 – 2012-03-02 (×16): 2 g via INTRAVENOUS
  Filled 2012-02-27 (×17): qty 2000

## 2012-02-27 MED ORDER — DOCUSATE SODIUM 100 MG PO CAPS
100.0000 mg | ORAL_CAPSULE | Freq: Every day | ORAL | Status: DC
  Administered 2012-02-27 – 2012-03-02 (×5): 100 mg via ORAL
  Filled 2012-02-27 (×6): qty 1

## 2012-02-27 MED ORDER — ZOLPIDEM TARTRATE 5 MG PO TABS
5.0000 mg | ORAL_TABLET | Freq: Every evening | ORAL | Status: DC | PRN
  Administered 2012-03-02 – 2012-03-03 (×2): 5 mg via ORAL
  Filled 2012-02-27 (×2): qty 1

## 2012-02-27 MED ORDER — LACTATED RINGERS IV SOLN: INTRAVENOUS | Status: DC

## 2012-02-27 NOTE — Consult Note (Signed)
Maternal Fetal Medicine Consultation  Requesting Provider(s): Arlyce Harman, MD  Reason for consultation: PROM at 27 2/7 weeks, pregestational diabetes  HPI: [name] is a 41 yo G6P5005 currenty at 27 2/7 weeks seen for consultation due to PROM.  She reports leakage of clear amniotic fluid since 0300 this morning.  On initial eval, the cervix was felt to be 1-2 cm by visual exam.  Since admission, she has had some irregular uterine contractions on toco, but has otherwise been asymptomatic.  She is currently on latency antibiotics (Ampicillin and po zithromax).  She denies vaginal bleeding or abdominal pain.    Pregnancy complicated by pregestational diabetes since 2010 with no known end organ disease.  She is currently being followed by Dr. Allena Katz (Endocrinology)- her current dose of insulin is 30 units Lantus in the AM; 36 units Lantus in PM and sliding scale Humolog with each meal.  Per report, her blood sugars are much better controlled than in early pregnancy.  OB History: OB History    Grav Para Term Preterm Abortions TAB SAB Ect Mult Living   6 5 4 1  0 0 0 0 0 5    Previous 35-36 week SVD associated with PROM - otherwise no complications  PMH:  Past Medical History  Diagnosis Date  . Type 2 diabetes mellitus     PSH: History reviewed. No pertinent past surgical history.  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  Would continue IV Ampicillin x 72 hours and transition of po Amoxicillin for 4 days (total 7 day course of antibiotics) and Azithromycin (Z-pack: 500 mg loading dose and 250 mg daily for total of 4 day course).   If not already performed, would get GBS cultures  and would re-start IV penicillin in labor (after latency antibiotics complete) if GBS positive. - May need to supplement insulin dose due to steroid affect for next 24-48 hours. Otherwise continue insulin dose and blood sugar testing was written - Antepartum fetal testing: if continuous monitoring stable for first 24+ hours, would transition to 2x daily NSTs - Expectant management - move toward delivery for s/sx of intra-amniotic infection.  Consider weekly CBC to follow white count as a possible early sign for chorioamnionitis - If not already ordered, would get growth ultrasound/ check presentation - If otherwise stable, would move toward delivery at [redacted] weeks gestation  I spoke with the the patient and her husband via telephone and discussed recommendations as outlined above.  Questions answered to their satisfaction.   Thank you for the opportunity to be a part of the care of [name]. Please contact our office if we can be of further assistance.   I spent approximately 30 minutes with this patient with over 50% of time spent in face-to-face counseling.  Alpha Gula, MD

## 2012-02-27 NOTE — MAU Note (Signed)
Pt states leaking began at 0300 "a lot of clear water"

## 2012-02-27 NOTE — Progress Notes (Signed)
Pt states checked CBG at home at 0500, CBG 130. Took 19 units of Novolog.

## 2012-02-27 NOTE — H&P (Addendum)
[name] is a 41 y.o. female G6 P5 presenting for  PROM at 3 am today, reports having a gush of fluid, clear.The patient says she presented to [facility] this morning around 7 am. She denies abdominal pain or symptoms of labor.  She is an insulin dependant diabetic ( AODM) during her pregnancy her diabetes has been followed by Dr Allena Katz (Endocrinologist) diabetes is currently well  Controlled.  Prenatal care started in the second trimester and diabetes was not initially well controlled. She has had 5 live births vaginally.  One was preterm at 35 weeks following PROM.  She has had weekly 17P injections starting in the second trimester.  OB History    Grav Para Term Preterm Abortions TAB SAB Ect Mult Living   6 5 4 1  0 0 0 0 0 5     Past Medical History  Diagnosis Date  . Type 2 diabetes mellitus    History reviewed. No pertinent past surgical history. Family History: Mother HTN Social History: Non contributory  ROS Non carntributory  Blood pressure 97/56, pulse 92, temperature 97.6 F (36.4 C), temperature source Oral, resp. rate 18, height 5' (1.524 m), weight 135 lb 9.6 oz (61.508 kg), last menstrual period 08/20/2011.  Physical Exam  General  Well developed female no acute distress HEENT NL CHEST CLEAR HEART  S1S2 CLEAR  Abd, soft, non tender BS present Ext nl  Cx appears sl open at 1-2 cm Pooling of clear Amnionic fluid noted.  Specimem sent for LS, PG  A: 27 3/7 weeks, PROM, No evidence of labor Admit, bedrest Consult MFM and NICU CBC diff, UA C & S, Cultures for GC, Chlamydia and GBS Erythromycin and Ampicillin Betamethasone 12mg .  Repeat in 24 hrs.   Prenatal labs: ABO, Rh:   Antibody:   Rubella:   RPR:    HBsAg:    HIV:    GBS:      Issaiah Seabrooks E 02/27/2012, 12:44 PM

## 2012-02-27 NOTE — Progress Notes (Signed)
IV attempt x 1 by Christianne Dolin, RN at 1000 IV attempt x 2 by Bobbe Medico, RN at 1005 Patient refuses stick in hand.

## 2012-02-27 NOTE — MAU Provider Note (Signed)
[name] is a 41 y.o. female @ [redacted]w[redacted]d gestation who presents to MAU with premature rupture of membranes.   BP 99/63  Pulse 72  Temp 98.2 F (36.8 C) (Oral)  Resp 18  Ht 5' (1.524 m)  Wt 135 lb 9.6 oz (61.508 kg)  BMI 26.48 kg/m2  LMP 08/20/2011  Exam: Large amount of pooling vaginal vault. Fluid is clear.   EFM: baseline 150, occasional contraction, every 30 to 45 minutes.   Assessment: PROM  Plan:  IV LR, patient placed in trendelenburg    Betamethasone 12.5 mg   CBC, RPR, GC, Chlamydia, Wet prep   Ultrasound  @ 09:00 am Dr. Neva Seat notified of patient status. She will come to MAU to evaluate the patient.  Results for orders placed during the hospital encounter of 02/27/12 (from the past 24 hour(s))  CBC WITH DIFFERENTIAL     Status: Abnormal   Collection Time   02/27/12  9:00 AM      Component Value Range   WBC 11.0 (*) 4.0 - 10.5 K/uL   RBC 3.69 (*) 3.87 - 5.11 MIL/uL   Hemoglobin 10.1 (*) 12.0 - 15.0 g/dL   HCT 16.1 (*) 09.6 - 04.5 %   MCV 82.4  78.0 - 100.0 fL   MCH 27.4  26.0 - 34.0 pg   MCHC 33.2  30.0 - 36.0 g/dL   RDW 40.9  81.1 - 91.4 %   Platelets 241  150 - 400 K/uL   Neutrophils Relative 64  43 - 77 %   Neutro Abs 7.0  1.7 - 7.7 K/uL   Lymphocytes Relative 25  12 - 46 %   Lymphs Abs 2.8  0.7 - 4.0 K/uL   Monocytes Relative 9  3 - 12 %   Monocytes Absolute 0.9  0.1 - 1.0 K/uL   Eosinophils Relative 2  0 - 5 %   Eosinophils Absolute 0.3  0.0 - 0.7 K/uL   Basophils Relative 0  0 - 1 %   Basophils Absolute 0.0  0.0 - 0.1 K/uL  WET PREP, GENITAL     Status: Abnormal   Collection Time   02/27/12  9:10 AM      Component Value Range   Yeast Wet Prep HPF POC NONE SEEN  NONE SEEN   Trich, Wet Prep NONE SEEN  NONE SEEN   Clue Cells Wet Prep HPF POC NONE SEEN  NONE SEEN   WBC, Wet Prep HPF POC FEW (*) NONE SEEN  GROUP B STREP BY PCR     Status: Abnormal   Collection Time   02/27/12  9:10 AM      Component Value Range   Group B strep by PCR POSITIVE (*)  NEGATIVE  GLUCOSE, CAPILLARY     Status: Abnormal   Collection Time   02/27/12 10:16 AM      Component Value Range   Glucose-Capillary 53 (*) 70 - 99 mg/dL   Comment 1 Documented in Chart    GLUCOSE, CAPILLARY     Status: Normal   Collection Time   02/27/12 11:17 AM      Component Value Range   Glucose-Capillary 99  70 - 99 mg/dL    Assessment: Premature rupture of membranes  Plan:  Admission orders written   Dr. Chilton Si to assume care  Called Dr. Neva Seat @ 11:15 am to discuss ultrasound results and plan of care. I will write admission orders for the patient and Dr. Neva Seat will continue care of patient. Medical  screening exam complete.

## 2012-02-27 NOTE — MAU Note (Signed)
Dr. Neva Seat notified at office due to patient asking when she would see her doctor.  I explained to the patient Dr. Neva Seat in office at this time and would be to see her as soon as possible.  Patient asked if she could see another doctor and I explained that she saw the Nurse Practitioner who make treatment plan based on communication with Dr. Neva Seat. Above patient conversation reported to Dr. Neva Seat.  I also informed Dr. Neva Seat, insulin orders needed to be added to the order placed by Kerrie Buffalo, NP.

## 2012-02-27 NOTE — MAU Note (Signed)
Patient states she has had leaking and changed 3 pads. Denies pain.

## 2012-02-27 NOTE — Consult Note (Addendum)
Neonatology Consult to Antenatal Patient:  Ms. Pyon is admitted today at 67 3/[redacted] weeks GA following SROM. She is a G6P5 with IDDM, followed by an endocrinologist and well-controlled. She is currently not having active labor. She is getting BMZ and IV Ampicillin.  I spoke with the patient alone. We discussed the worst case of delivery in the next 1-2 days, including usual DR management, possible respiratory complications and need for support, IV access, feedings (mother desires breast feeding, which was encouraged), LOS, Mortality and Morbidity, but we did not discuss long term outcomes (I felt she was having difficulty absorbing what we had already discussed). She did not have any questions at this time. I offered a NICU tour to any interested family members and would be glad to come back if she has more questions later. She said her husband would be coming back this evening and would probably have some questions.  Thank you for asking me to see this patient.  Deatra James, MD Neonatologist  Time spent: 20 minutes

## 2012-02-28 LAB — CBC WITH DIFFERENTIAL/PLATELET
Eosinophils Absolute: 0 10*3/uL (ref 0.0–0.7)
Eosinophils Relative: 0 % (ref 0–5)
HCT: 29.4 % — ABNORMAL LOW (ref 36.0–46.0)
Hemoglobin: 9.9 g/dL — ABNORMAL LOW (ref 12.0–15.0)
Lymphocytes Relative: 18 % (ref 12–46)
Lymphs Abs: 2.4 10*3/uL (ref 0.7–4.0)
MCH: 27.9 pg (ref 26.0–34.0)
MCV: 82.8 fL (ref 78.0–100.0)
Monocytes Absolute: 1 10*3/uL (ref 0.1–1.0)
Monocytes Relative: 7 % (ref 3–12)
Platelets: 229 10*3/uL (ref 150–400)
RBC: 3.55 MIL/uL — ABNORMAL LOW (ref 3.87–5.11)
WBC: 13.7 10*3/uL — ABNORMAL HIGH (ref 4.0–10.5)

## 2012-02-28 LAB — GC/CHLAMYDIA PROBE AMP, GENITAL
Chlamydia, DNA Probe: NEGATIVE
GC Probe Amp, Genital: NEGATIVE

## 2012-02-28 LAB — GLUCOSE, CAPILLARY
Glucose-Capillary: 116 mg/dL — ABNORMAL HIGH (ref 70–99)
Glucose-Capillary: 257 mg/dL — ABNORMAL HIGH (ref 70–99)

## 2012-02-28 MED ORDER — INSULIN ASPART 100 UNIT/ML ~~LOC~~ SOLN
1.0000 [IU] | Freq: Every day | SUBCUTANEOUS | Status: DC
  Administered 2012-02-28: 1 [IU] via SUBCUTANEOUS

## 2012-02-28 MED ORDER — INSULIN ASPART 100 UNIT/ML ~~LOC~~ SOLN
11.0000 [IU] | Freq: Every day | SUBCUTANEOUS | Status: DC
  Administered 2012-02-29: 21 [IU] via SUBCUTANEOUS
  Administered 2012-03-01 – 2012-03-02 (×2): 11 [IU] via SUBCUTANEOUS
  Administered 2012-03-03: 19 [IU] via SUBCUTANEOUS
  Administered 2012-03-04: 21 [IU] via SUBCUTANEOUS

## 2012-02-28 MED ORDER — INSULIN ASPART 100 UNIT/ML ~~LOC~~ SOLN
10.0000 [IU] | Freq: Every day | SUBCUTANEOUS | Status: DC
  Administered 2012-02-28: 21 [IU] via SUBCUTANEOUS
  Administered 2012-02-29: 20 [IU] via SUBCUTANEOUS
  Administered 2012-03-01: 18 [IU] via SUBCUTANEOUS
  Administered 2012-03-02: 17 [IU] via SUBCUTANEOUS
  Administered 2012-03-03 – 2012-03-04 (×2): 19 [IU] via SUBCUTANEOUS

## 2012-02-28 MED ORDER — INSULIN ASPART 100 UNIT/ML ~~LOC~~ SOLN: 0.0000 [IU] | Freq: Three times a day (TID) | SUBCUTANEOUS | Status: DC

## 2012-02-28 MED ORDER — INSULIN ASPART 100 UNIT/ML ~~LOC~~ SOLN
12.0000 [IU] | Freq: Every day | SUBCUTANEOUS | Status: DC
  Administered 2012-02-29 – 2012-03-01 (×2): 21 [IU] via SUBCUTANEOUS
  Administered 2012-03-02 – 2012-03-03 (×2): 20 [IU] via SUBCUTANEOUS
  Administered 2012-03-04: 22 [IU] via SUBCUTANEOUS

## 2012-02-28 NOTE — Progress Notes (Signed)
Hospital Day: 2  S: Preterm labor symptoms: fluid leakage and light bleeding  O: Blood pressure 89/51, pulse 88, temperature 97.9 F (36.6 C), temperature source Oral, resp. rate 18, height 5' (1.524 m), weight 135 lb 9.6 oz (61.508 kg), last menstrual period 08/20/2011, SpO2 96.00%.   FHT:wnl Toco: irregular mild CTX SVE: Visual 1-2 cm 8/30 Pooling Abd   soft, nontender  Labs:  Positive GBBS Neg GC, Chlamydia Urine culture pending  [id number] (was 11,000) HCT  29.4 Limited US   8/30 vtx, fluid present, decreased  A/P- 41 y.o. admitted with PROM, 27+ weeks Watch for signs , symptoms of PTL and or infection, will deliver Class B diabetes Anemia Light bleeding this morning. No c/o ctxs, pain Group B Strep Positive  Patient Active Hospital Problem List: No active hospital problems.   Pregnancy Complications: PROM, diabetes  Preterm labor management: no treatment necessary, bedrest Dating:  [redacted]w[redacted]d  ROD: spontaneous vaginal

## 2012-02-29 ENCOUNTER — Inpatient Hospital Stay (HOSPITAL_COMMUNITY): Payer: Medicaid Other

## 2012-02-29 LAB — CBC WITH DIFFERENTIAL/PLATELET
Basophils Absolute: 0 10*3/uL (ref 0.0–0.1)
Basophils Relative: 0 % (ref 0–1)
Eosinophils Relative: 0 % (ref 0–5)
Hemoglobin: 9.6 g/dL — ABNORMAL LOW (ref 12.0–15.0)
Lymphocytes Relative: 13 % (ref 12–46)
Lymphs Abs: 1.8 10*3/uL (ref 0.7–4.0)
MCHC: 33.4 g/dL (ref 30.0–36.0)
Monocytes Relative: 1 % — ABNORMAL LOW (ref 3–12)
Neutro Abs: 12.2 10*3/uL — ABNORMAL HIGH (ref 1.7–7.7)
Neutrophils Relative %: 85 % — ABNORMAL HIGH (ref 43–77)
Promyelocytes Absolute: 0 %
RBC: 3.46 MIL/uL — ABNORMAL LOW (ref 3.87–5.11)
WBC: 14.1 10*3/uL — ABNORMAL HIGH (ref 4.0–10.5)

## 2012-02-29 LAB — GLUCOSE, CAPILLARY
Glucose-Capillary: 184 mg/dL — ABNORMAL HIGH (ref 70–99)
Glucose-Capillary: 228 mg/dL — ABNORMAL HIGH (ref 70–99)

## 2012-02-29 LAB — URINE CULTURE

## 2012-02-29 NOTE — Progress Notes (Signed)
Hosp Day 2, Antibiotic day 2  S: Preterm labor symptoms: No contractions felt by patient.  Mild contractions, irregular around 4 in past hour noted on monitor.  Also variable, moderate deceleration occasionally noted, resolves spontaneously and remaining pattern with good variability and accelerations  O: Blood pressure 123/95, pulse 74, temperature 98.3 F (36.8 C), temperature source Oral, resp. rate 18, height 5' (1.524 m), weight 135 lb 9.6 oz (61.508 kg), last menstrual period 08/20/2011, SpO2 95.00%.   [id number]: 140s-150s bpm, Variability: Good {> 6 bpm), Accelerations: Reactive and Decelerations: Variable: moderate Toco: Frequency: 4 times per hour SVE: no WBC 14,100  A/P- 41 y.o. admitted wit Pre gestational diabetes, PROM at 27+ weeks:  Patient Active Hospital Problem List: No active hospital problems.    Dating:  [redacted]w[redacted]d

## 2012-02-29 NOTE — Progress Notes (Signed)
U/S tech at the bedside for U/S.  EFM & Toco removed.

## 2012-02-29 NOTE — Progress Notes (Signed)
RN to the bedside to encourage patient to allow [place] to monitor fetus. Pt. Sitting in rocking chair, pt. States, "I go to the BR first." Report given to night shift RN.

## 2012-02-29 NOTE — Progress Notes (Signed)
Upon entering room pt. Request to go to BR.  RN encouraged pt. To use bedpan and reinforced MD's orders (complete bedrest and continuous fetal monitoring), but patient insistent about going to BR and taking a break from continuous monitoring. RN reinforced education - regarding continuous fetal monitoring due to vaginal spotting and intermittent  decrease fetal heart rate). Pt. Stated "OK", "but I need to be off just for a little".

## 2012-02-29 NOTE — Progress Notes (Signed)
RN to the bedside to assist. Pt. To BR for BM.  Pt. Informed of MD's orders - up to BR for BM for 5 min. Pt. Stated, "it might take longer".  RN assist pt. To BR.  Bed made - clean linen applied.

## 2012-03-01 LAB — CBC WITH DIFFERENTIAL/PLATELET
Basophils Absolute: 0 10*3/uL (ref 0.0–0.1)
Basophils Relative: 0 % (ref 0–1)
Eosinophils Absolute: 0.1 10*3/uL (ref 0.0–0.7)
MCH: 27.6 pg (ref 26.0–34.0)
MCHC: 33.1 g/dL (ref 30.0–36.0)
Neutrophils Relative %: 55 % (ref 43–77)
Platelets: 231 10*3/uL (ref 150–400)
RBC: 3.55 MIL/uL — ABNORMAL LOW (ref 3.87–5.11)

## 2012-03-01 LAB — GLUCOSE, CAPILLARY
Glucose-Capillary: 97 mg/dL (ref 70–99)
Glucose-Capillary: 133 mg/dL — ABNORMAL HIGH (ref 70–99)

## 2012-03-01 NOTE — Progress Notes (Signed)
rn called to the room, pt requesting to come off the monitors and get up to the restroom to wash her face, brush teeth and void. rn reminded pt that these are not the recommendations from the doctor, we recommend cbr at this time. Pt verbalized understanding but still wants to get out of the bed. Pt removed monitors and rn took off scds, pt up to bathroom. rn asked pt to call when back to bed to have monitors replaced.pt verbalized understanding.

## 2012-03-01 NOTE — Progress Notes (Signed)
rn called to the room for iv beeping, pt standing at the bedside initially. rn offered assistance getting back to bed but pt refused and reports she is going to sit in the rocking chair for a while. rn reminded pt that we would like for her to be in bed resting, pt says she wants to sit up, will go back to bed when she is ready. Iv beeping stopped and pt in rocking chair - rn asked her to call when breakfast arrives for her insulin.

## 2012-03-01 NOTE — Progress Notes (Signed)
scds reapplied, pt back to bed and ready to monitor, monitors reapplied.

## 2012-03-01 NOTE — Progress Notes (Signed)
rn called to the room, pt wanting to use the bathroom. When entering the room, pt reports she wants to get up to bathroom. Reminded pt that dr Neva Seat was here this morning and recommended being in the bed all the time, this included using the bedpan. rn encouraged pt to use the bedpan and explained the risks that dr Neva Seat had shared with her earlier. Pt reports that she understands but she still wants to get up. rn stayed at the bedside while pt up to the bathroom. No complications, pt up to void.

## 2012-03-01 NOTE — Progress Notes (Signed)
03/01/12 1600  Clinical Encounter Type  Visited With Patient and family together (Husband, youngest child (son, age 40))  Visit Type Initial;Spiritual support;Social support Teacher, adult education)  Referral From Nurse Gomez Cleverly. Richardson Dopp, RN)  Spiritual Encounters  Spiritual Needs Emotional (Faith reflection)  Stress Factors  Family Stress Factors (Trying to avoid too much support (overwhelming)!)    Visited with Ms Beckles and her husband for spiritual/emotional support and encouragement as she deals with the restrictions and physical discomfort of unexpected bed rest. Ms Kretchmer is looking forward to reassessment after one week, hoping that the plan of care may be adjusted.  Husband shared with kindness and gratitude about their fabulous care at [facility] and their extensive support network.  In fact, he notes, they have been careful not to have Ms Vanengen's hospitalization announced at the mosque, because their faithful community will flood them with visitors to honor the Muslim obligation to visit and to care for people who are sick.  Already, he continues, they have had countless offers of help, as well as prayers, visits to their home, deliveries of meals, and offers to care for their children.  Provided opportunity for him to share their story, space for spiritual reflection, and space for her to acknowledge her discomfort in the midst of gratitude for care and safety.  Family is aware of ongoing chaplain availability.  Spiritual Care will continue to follow for support.   [address] [place], [place] [phone]

## 2012-03-01 NOTE — Progress Notes (Addendum)
Hospital Day: 4  S:41 y o G6P5 diabetic with PROM at 27+ wks  Preterm labor symptoms: fluid leakage and light bleeding. Denies contractions. [name] has been noncompliant with bed rest restrictions and continuous monitoring.  O: Blood pressure 105/64, pulse 81, temperature 98.4 F (36.9 C), temperature source Oral, resp. rate 18, height 5' (1.524 m), weight 61.508 kg (135 lb 9.6 oz), last menstrual period 08/20/2011, SpO2 95.00%.   NWG:NFAOZHYQ: 140s to 150s bpm and Decelerations: Variable: moderate   Rare severe spontaneous deceleration down to 60s, which last one minute and completely recovers with great accelerations and variability.  Toco: rare contraction noted today. SVE: na  [place] 02/29/12   Presentation: transverse with head to maternal left Subjectively decreased fluid EFW 1187 gms Placenta anterior  A/P- 41 y.o. admitted with PROM at 27 2/7 weeks, now 27 5/7 weeks with transverse lie  Positive GBS P: p o antibiotics starting tomorrow.  Pregestational insulin dependent diabetes, controlled  Non compliance-- I have discussed the need for bedrest and monitoring in this high risk situation of PROM at 27 5/7 weeks. [name] says she understands but is tired of being in the bed and will do as she pleases.  I warned her that she could have a cord accident or if in the bathroom too long straining due to rectal pressure could possibly have a precipitous premature delivery alone and we do not want that to happen.       Patient Active Hospital Problem List: No active hospital problems.   Pregnancy Complications: PROM, Class B diabetes  Preterm labor management: bedrest advised and pelvic rest advised Dating:  [redacted]w[redacted]d

## 2012-03-01 NOTE — Progress Notes (Signed)
Ur chart review completed.  

## 2012-03-02 LAB — GLUCOSE, CAPILLARY
Glucose-Capillary: 43 mg/dL — CL (ref 70–99)
Glucose-Capillary: 53 mg/dL — ABNORMAL LOW (ref 70–99)
Glucose-Capillary: 98 mg/dL (ref 70–99)
Glucose-Capillary: 135 mg/dL — ABNORMAL HIGH (ref 70–99)

## 2012-03-02 MED ORDER — FERROUS SULFATE 325 (65 FE) MG PO TABS
325.0000 mg | ORAL_TABLET | Freq: Three times a day (TID) | ORAL | Status: DC
  Administered 2012-03-02 – 2012-03-04 (×6): 325 mg via ORAL
  Filled 2012-03-02 (×6): qty 1

## 2012-03-02 MED ORDER — DOCUSATE SODIUM 100 MG PO CAPS
100.0000 mg | ORAL_CAPSULE | Freq: Three times a day (TID) | ORAL | Status: DC
  Administered 2012-03-02 – 2012-03-04 (×7): 100 mg via ORAL
  Filled 2012-03-02 (×7): qty 1

## 2012-03-02 MED ORDER — AMOXICILLIN 500 MG PO CAPS
500.0000 mg | ORAL_CAPSULE | Freq: Three times a day (TID) | ORAL | Status: DC
  Administered 2012-03-02 – 2012-03-04 (×8): 500 mg via ORAL
  Filled 2012-03-02 (×10): qty 1

## 2012-03-02 NOTE — Progress Notes (Signed)
Patient ID: [name], female   DOB: [birth year], 41 y.o.   MRN: [id number] HD# 4 S. complains of back pain, denies fever denies decreased fetal movement and denies any increase in the amount of fluid lost.  She states that she had some leaking fluid last night, but not day.  She has stated that they spotting seems to be just dark brownish discharge at times.  She stated she was hungry.  O. Temp:  [97.4 F (36.3 C)-98.4 F (36.9 C)] 98.2 F (36.8 C) (09/03 0749) Pulse Rate:  [70-82] 70  (09/03 0749) Resp:  [17-24] 24  (09/03 0749) BP: (92-110)/(56-70) 110/70 mmHg (09/03 0749)  CBC    Component Value Date/Time   WBC 10.9* 03/01/2012 0910   RBC 3.55* 03/01/2012 0910   HGB 9.8* 03/01/2012 0910   HCT 29.6* 03/01/2012 0910   PLT 231 03/01/2012 0910   MCV 83.4 03/01/2012 0910   MCH 27.6 03/01/2012 0910   MCHC 33.1 03/01/2012 0910   RDW 14.5 03/01/2012 0910   LYMPHSABS 3.6 03/01/2012 0910   MONOABS 1.2* 03/01/2012 0910   EOSABS 0.1 03/01/2012 0910   BASOSABS 0.0 03/01/2012 0910   CMP     Component Value Date/Time   NA 140 12/10/2007 2101   K 4.0 12/10/2007 2101   CL 107 12/10/2007 2101   CO2 23 12/10/2007 2101   GLUCOSE 75 12/10/2007 2101   BUN 15 12/10/2007 2101   CREATININE 0.48 12/10/2007 2101   CALCIUM 9.2 12/10/2007 2101   PROT 7.7 12/10/2007 2101   ALBUMIN 4.3 12/10/2007 2101   AST 12 12/10/2007 2101   ALT 12 12/10/2007 2101   ALKPHOS 74 12/10/2007 2101   BILITOT 0.6 12/10/2007 2101   EXAM: General: Well-appearing female in no acute distress Abdomen is gravid and nontender Extremities nontender no edema the SCDs were not on and this was stressed to have these done to avoid DVT/PE complications.  Assessment: IUP at Preterm premature rupture of membranes Class AII diabetes mellitus GBS positive Anemia   Plan: We'll proceed with the Zithromax and amoxicillin and will convert the amoxicillin to po We'll continue with sliding scale We'll start iron tablets We'll see if we can make her bed more  comfortable   Will allow her to shower.

## 2012-03-02 NOTE — Progress Notes (Signed)
Patient states that she was feeling shaky, and uncomfortable. CBG level checked and was 43. A snack was given and her cbg is now 97. Patient states that she feels better, will continue to monitor patient.

## 2012-03-02 NOTE — Progress Notes (Signed)
Pat up to br for BM pt stated that stomach was cramping  Then sat on couch for several mins

## 2012-03-02 NOTE — Progress Notes (Signed)
Pt having difficulty understanding that she can only order so many carbs   Pt wants nursing to call "[name]" spoke with our dietician about carbs does not agree needs more protein  Also spoke with diabetes coord about BS

## 2012-03-03 ENCOUNTER — Inpatient Hospital Stay (HOSPITAL_COMMUNITY)
Admit: 2012-03-03 | Discharge: 2012-03-03 | Disposition: A | Discharge status: Home / Self Care | Payer: Medicaid Other | Attending: Obstetrics & Gynecology | Admitting: Obstetrics & Gynecology

## 2012-03-03 LAB — GLUCOSE, CAPILLARY
Glucose-Capillary: 82 mg/dL (ref 70–99)
Glucose-Capillary: 167 mg/dL — ABNORMAL HIGH (ref 70–99)

## 2012-03-03 NOTE — Progress Notes (Signed)
Pt refuses to have monitors put on at this time. Refuses to use bedpan, states i go to bathroom. Refusing to wear scds at this time. Reinforced importance of all of above, md orders, pt still refuses.

## 2012-03-03 NOTE — Progress Notes (Signed)
[redacted] weeks gestation, with PROM, IDDM/GDM.  Height  60" Weight 135 Lbs pre-pregnancy weight 115 Lbs.Pre-pregnancy  BMI 22.5  IBW 100 Lbs  Total weight gain 20 Lbs. Weight gain goals 25-35 Lbs.   Estimated needs: 16-1800 kcal/day, 57-67 grams protein/day, 1.8 liters fluid/day Carbohydrate modified gestational diet tolerated well, appetite good. Pt c/o hunger, concerned with low blood sugar. Not clear about grams of CHO allowed per meal and snack. Ordered double protein portions at meals and snacks to reduce hunger. Pt requests kosher chicken.  Current diet prescription will provide for increased needs. No abnormal nutrition related labs  CBG (last 3)   Basename 03/03/12 1403 03/03/12 0816 03/03/12 0616  GLUCAP 125* 111* 82   6/27 A1C 10.3  Nutrition Dx: Increased nutrient needs r/t pregnancy and fetal growth requirements aeb [redacted] weeks gestation.  No educational needs assessed at this time. Pt received diet education 12/19/11, MFM I reviewed carbohydrate limit for meals and snacks with pt. ( 30 g at breakfast, and snacks. 45 grams at lunch and dinner)  Copy of meal plan was given to pt for reference.  Elisabeth Cara M.Odis Luster LDN Neonatal Nutrition Support Specialist Pager [phone]

## 2012-03-03 NOTE — Progress Notes (Signed)
Patient ID: [name], female   DOB: 08-Mar-1971, 41 y.o.   MRN: [id number] Patient ID: [name], female   DOB: May 13, 1971, 41 y.o.   MRN: [id number] HD# 5 S. complains of back pain, denies fever denies decreased fetal movement and denies any increase in the amount of fluid lost.  She states that she had some leaking fluid last night..  She has stated that they spotting is mostly gone. [place]. Temp:  [98.1 F (36.7 C)-98.4 F (36.9 C)] 98.1 F (36.7 C) (09/04 0727) Pulse Rate:  [71-91] 91  (09/04 0727) Resp:  [18-24] 20  (09/04 0727) BP: (86-116)/(46-65) 104/65 mmHg (09/04 0727)  CBC    Component Value Date/Time   WBC 10.9* 03/01/2012 0910   RBC 3.55* 03/01/2012 0910   HGB 9.8* 03/01/2012 0910   HCT 29.6* 03/01/2012 0910   PLT 231 03/01/2012 0910   MCV 83.4 03/01/2012 0910   MCH 27.6 03/01/2012 0910   MCHC 33.1 03/01/2012 0910   RDW 14.5 03/01/2012 0910   LYMPHSABS 3.6 03/01/2012 0910   MONOABS 1.2* 03/01/2012 0910   EOSABS 0.1 03/01/2012 0910   BASOSABS 0.0 03/01/2012 0910   CMP     Component Value Date/Time   NA 140 12/10/2007 2101   K 4.0 12/10/2007 2101   CL 107 12/10/2007 2101   CO2 23 12/10/2007 2101   GLUCOSE 75 12/10/2007 2101   BUN 15 12/10/2007 2101   CREATININE 0.48 12/10/2007 2101   CALCIUM 9.2 12/10/2007 2101   PROT 7.7 12/10/2007 2101   ALBUMIN 4.3 12/10/2007 2101   AST 12 12/10/2007 2101   ALT 12 12/10/2007 2101   ALKPHOS 74 12/10/2007 2101   BILITOT 0.6 12/10/2007 2101   EXAM: General: Well-appearing female in no acute distress Abdomen is gravid and nontender Extremities nontender no edema the SCDs were not on and this was stressed to have these done to avoid DVT/PE complications.  Assessment: IUP at 28w0 Preterm premature rupture of membranes Class AII diabetes mellitus on preprandial checks  GBS positive Anemia   Plan: We'll proceed with the Zithromax and amoxicillin  We'll continue with sliding scale We'll start iron tablets We'll see if we can make her bed more comfortable    Will allow her to shower. Will repeat labs and check Hgb A1c

## 2012-03-04 LAB — CBC WITH DIFFERENTIAL/PLATELET
Basophils Absolute: 0 10*3/uL (ref 0.0–0.1)
Basophils Relative: 0 % (ref 0–1)
HCT: 31 % — ABNORMAL LOW (ref 36.0–46.0)
Hemoglobin: 10.2 g/dL — ABNORMAL LOW (ref 12.0–15.0)
Lymphs Abs: 3.2 10*3/uL (ref 0.7–4.0)
MCHC: 32.9 g/dL (ref 30.0–36.0)
MCV: 83.8 fL (ref 78.0–100.0)
Monocytes Relative: 9 % (ref 3–12)
Neutro Abs: 7.2 10*3/uL (ref 1.7–7.7)
RDW: 15 % (ref 11.5–15.5)
WBC: 11.6 10*3/uL — ABNORMAL HIGH (ref 4.0–10.5)

## 2012-03-04 LAB — BASIC METABOLIC PANEL
BUN: 16 mg/dL (ref 6–23)
CO2: 21 mEq/L (ref 19–32)
Calcium: 8.2 mg/dL — ABNORMAL LOW (ref 8.4–10.5)
Chloride: 103 mEq/L (ref 96–112)
Creatinine, Ser: 0.54 mg/dL (ref 0.50–1.10)

## 2012-03-04 LAB — CREATININE CLEARANCE, URINE, 24 HOUR
Collection Interval-CRCL: 24 hours
Creatinine Clearance: 180 mL/min — ABNORMAL HIGH (ref 75–115)
Creatinine, 24H Ur: 1398 mg/d (ref 700–1800)
Creatinine, Urine: 26.88 mg/dL
Urine Total Volume-CRCL: 5200 mL

## 2012-03-04 LAB — GLUCOSE, CAPILLARY
Glucose-Capillary: 191 mg/dL — ABNORMAL HIGH (ref 70–99)
Glucose-Capillary: 128 mg/dL — ABNORMAL HIGH (ref 70–99)

## 2012-03-04 MED ORDER — SODIUM CHLORIDE 0.9 % IJ SOLN
3.0000 mL | Freq: Two times a day (BID) | INTRAMUSCULAR | Status: DC
  Administered 2012-03-04: 3 mL via INTRAVENOUS

## 2012-03-04 MED ORDER — POLYETHYLENE GLYCOL 3350 17 G PO PACK
17.0000 g | PACK | Freq: Every day | ORAL | Status: DC | PRN
  Filled 2012-03-04: qty 1

## 2012-03-04 NOTE — Progress Notes (Signed)
Appreciate Dr Kathi Der note. Agree.

## 2012-03-04 NOTE — Progress Notes (Signed)
Monitors off while pt is sitting up to eat a piece of chicken. Will replace when finished.

## 2012-03-04 NOTE — Progress Notes (Signed)
Pt still working on her breakfast, doesn't want to go back to bed at this time. Will call rn when ready to monitor.

## 2012-03-04 NOTE — Progress Notes (Addendum)
Patient ID: [name], female   DOB: [birth year], 41 y.o.   MRN: [id number] HD# 7 S. complains of back pain,  And would like to eat meals out of the bed denies fever denies decreased fetal movement and denies any increase in the amount of fluid lost.  She states that she had some leaking fluid last night..  She has stated that they spotting is mostly gone. [place]. Temp:  [98.1 F (36.7 C)-98.7 F (37.1 C)] 98.2 F (36.8 C) (09/05 0120) Pulse Rate:  [76-91] 76  (09/04 2018) Resp:  [18-20] 18  (09/04 2018) BP: (103-134)/(52-76) 111/76 mmHg (09/04 2018)  CBC    Component Value Date/Time   WBC 11.6* 03/04/2012 0445   RBC 3.70* 03/04/2012 0445   HGB 10.2* 03/04/2012 0445   HCT 31.0* 03/04/2012 0445   PLT 237 03/04/2012 0445   MCV 83.8 03/04/2012 0445   MCH 27.6 03/04/2012 0445   MCHC 32.9 03/04/2012 0445   RDW 15.0 03/04/2012 0445   LYMPHSABS 3.2 03/04/2012 0445   MONOABS 1.0 03/04/2012 0445   EOSABS 0.2 03/04/2012 0445   BASOSABS 0.0 03/04/2012 0445   CMP     Component Value Date/Time   NA 134* 03/04/2012 0445   K 3.6 03/04/2012 0445   CL 103 03/04/2012 0445   CO2 21 03/04/2012 0445   GLUCOSE 163* 03/04/2012 0445   BUN 16 03/04/2012 0445   CREATININE 0.54 03/04/2012 0445   CALCIUM 8.2* 03/04/2012 0445   PROT 7.7 12/10/2007 2101   ALBUMIN 4.3 12/10/2007 2101   AST 12 12/10/2007 2101   ALT 12 12/10/2007 2101   ALKPHOS 74 12/10/2007 2101   BILITOT 0.6 12/10/2007 2101   GFRNONAA >90 03/04/2012 0445   GFRAA >90 03/04/2012 0445   EXAM: General: Well-appearing female in no acute distress Abdomen is gravid and nontender Extremities nontender no edema the SCDs were not on and this was stressed to have these done to avoid DVT/PE complications.   ULTRASOUND: BPP 8/8 AFI 8 Assessment: IUP at 28w1 Preterm premature rupture of membranes Class AII diabetes mellitus on preprandial checks  GBS positive Anemia Breech  Plan: We'll proceed with the Zithromax and amoxicillin  We'll continue with sliding scale We'll start iron  tablets We'll see if we can make her bed more comfortable   Will allow her to shower. Will repeat labs and check Hgb A1c - pending

## 2012-03-04 NOTE — Progress Notes (Signed)
UR Chart review completed.  

## 2012-03-04 NOTE — Progress Notes (Signed)
moniotrs off per pt request, new md order for pt to come continuous monitoring for meals, pt up to bathroom and then to the sofa to await her breakfast. rn asked pt to wait til it arrives, pt adament about getting up - pt reports food should be here soon and she is ready to take monitors off.

## 2012-03-05 ENCOUNTER — Encounter (HOSPITAL_COMMUNITY)
Admission: AD | Disposition: A | Discharge status: Home / Self Care | Payer: Self-pay | Source: Ambulatory Visit | Attending: Obstetrics and Gynecology

## 2012-03-05 ENCOUNTER — Encounter (HOSPITAL_COMMUNITY): Payer: Self-pay | Admitting: Anesthesiology

## 2012-03-05 ENCOUNTER — Encounter (HOSPITAL_COMMUNITY): Payer: Self-pay | Admitting: *Deleted

## 2012-03-05 ENCOUNTER — Inpatient Hospital Stay (HOSPITAL_COMMUNITY): Payer: Medicaid Other

## 2012-03-05 ENCOUNTER — Inpatient Hospital Stay (HOSPITAL_COMMUNITY): Payer: Medicaid Other | Admitting: Anesthesiology

## 2012-03-05 LAB — ABO/RH: ABO/RH(D): O POS

## 2012-03-05 LAB — GLUCOSE, CAPILLARY: Glucose-Capillary: 140 mg/dL — ABNORMAL HIGH (ref 70–99)

## 2012-03-05 LAB — CBC
HCT: 33.1 % — ABNORMAL LOW (ref 36.0–46.0)
Hemoglobin: 11.1 g/dL — ABNORMAL LOW (ref 12.0–15.0)
MCH: 27.8 pg (ref 26.0–34.0)
MCHC: 33.5 g/dL (ref 30.0–36.0)
MCV: 83 fL (ref 78.0–100.0)
RDW: 14.9 % (ref 11.5–15.5)

## 2012-03-05 LAB — KLEIHAUER-BETKE STAIN
Fetal Cells %: 0 %
Quantitation Fetal Hemoglobin: 0 mL

## 2012-03-05 SURGERY — Surgical Case
Anesthesia: Spinal | Site: Abdomen | Wound class: Clean Contaminated

## 2012-03-05 MED ORDER — MEDROXYPROGESTERONE ACETATE 150 MG/ML IM SUSP
150.0000 mg | INTRAMUSCULAR | Status: DC | PRN
Start: 2012-03-05 — End: 2012-03-08

## 2012-03-05 MED ORDER — CITRIC ACID-SODIUM CITRATE 334-500 MG/5ML PO SOLN
30.0000 mL | Freq: Once | ORAL | Status: AC
  Administered 2012-03-05: 30 mL via ORAL

## 2012-03-05 MED ORDER — CEFAZOLIN SODIUM-DEXTROSE 2-3 GM-% IV SOLR
INTRAVENOUS | Status: AC
  Filled 2012-03-05: qty 50

## 2012-03-05 MED ORDER — MORPHINE SULFATE (PF) 0.5 MG/ML IJ SOLN
INTRAMUSCULAR | Status: DC | PRN
  Administered 2012-03-05: .15 mg via INTRATHECAL

## 2012-03-05 MED ORDER — ONDANSETRON HCL 4 MG/2ML IJ SOLN: 4.0000 mg | INTRAMUSCULAR | Status: DC | PRN

## 2012-03-05 MED ORDER — LACTATED RINGERS IV BOLUS (SEPSIS): 250.0000 mL | Freq: Once | INTRAVENOUS | Status: DC

## 2012-03-05 MED ORDER — KETOROLAC TROMETHAMINE 60 MG/2ML IM SOLN
60.0000 mg | Freq: Once | INTRAMUSCULAR | Status: AC | PRN
  Administered 2012-03-05: 60 mg via INTRAMUSCULAR

## 2012-03-05 MED ORDER — ONDANSETRON HCL 4 MG/2ML IJ SOLN
INTRAMUSCULAR | Status: AC
  Filled 2012-03-05: qty 2

## 2012-03-05 MED ORDER — OXYTOCIN 10 UNIT/ML IJ SOLN
INTRAMUSCULAR | Status: AC
  Filled 2012-03-05: qty 4

## 2012-03-05 MED ORDER — CITRIC ACID-SODIUM CITRATE 334-500 MG/5ML PO SOLN
ORAL | Status: AC
  Filled 2012-03-05: qty 15

## 2012-03-05 MED ORDER — PHENYLEPHRINE 40 MCG/ML (10ML) SYRINGE FOR IV PUSH (FOR BLOOD PRESSURE SUPPORT)
PREFILLED_SYRINGE | INTRAVENOUS | Status: AC
  Filled 2012-03-05: qty 5

## 2012-03-05 MED ORDER — KETOROLAC TROMETHAMINE 30 MG/ML IJ SOLN: 30.0000 mg | Freq: Four times a day (QID) | INTRAMUSCULAR | Status: AC | PRN

## 2012-03-05 MED ORDER — ZOLPIDEM TARTRATE 5 MG PO TABS: 5.0000 mg | ORAL_TABLET | Freq: Every evening | ORAL | Status: DC | PRN

## 2012-03-05 MED ORDER — INSULIN ASPART 100 UNIT/ML ~~LOC~~ SOLN
0.0000 [IU] | SUBCUTANEOUS | Status: AC
  Administered 2012-03-06: 12 [IU] via SUBCUTANEOUS
  Administered 2012-03-06: 2 [IU] via SUBCUTANEOUS
  Administered 2012-03-06: 4 [IU] via SUBCUTANEOUS

## 2012-03-05 MED ORDER — MEPERIDINE HCL 25 MG/ML IJ SOLN: 6.2500 mg | INTRAMUSCULAR | Status: DC | PRN

## 2012-03-05 MED ORDER — DIBUCAINE 1 % RE OINT: 1.0000 "application " | TOPICAL_OINTMENT | RECTAL | Status: DC | PRN

## 2012-03-05 MED ORDER — OXYTOCIN 10 UNIT/ML IJ SOLN
40.0000 [IU] | INTRAVENOUS | Status: DC | PRN
  Administered 2012-03-05: 40 [IU] via INTRAVENOUS

## 2012-03-05 MED ORDER — PHENYLEPHRINE HCL 10 MG/ML IJ SOLN
INTRAMUSCULAR | Status: DC | PRN
  Administered 2012-03-05 (×5): 40 ug via INTRAVENOUS

## 2012-03-05 MED ORDER — MEASLES, MUMPS & RUBELLA VAC ~~LOC~~ INJ
0.5000 mL | INJECTION | Freq: Once | SUBCUTANEOUS | Status: DC
  Filled 2012-03-05: qty 0.5

## 2012-03-05 MED ORDER — NALOXONE HCL 0.4 MG/ML IJ SOLN: 0.4000 mg | INTRAMUSCULAR | Status: DC | PRN

## 2012-03-05 MED ORDER — SIMETHICONE 80 MG PO CHEW: 80.0000 mg | CHEWABLE_TABLET | ORAL | Status: DC | PRN

## 2012-03-05 MED ORDER — NALBUPHINE SYRINGE 5 MG/0.5 ML
INJECTION | INTRAMUSCULAR | Status: AC
  Administered 2012-03-05: 5 mg via INTRAVENOUS
  Filled 2012-03-05: qty 0.5

## 2012-03-05 MED ORDER — DIPHENHYDRAMINE HCL 25 MG PO CAPS: 25.0000 mg | ORAL_CAPSULE | ORAL | Status: DC | PRN

## 2012-03-05 MED ORDER — LACTATED RINGERS IV SOLN: INTRAVENOUS | Status: DC

## 2012-03-05 MED ORDER — WITCH HAZEL-GLYCERIN EX PADS: 1.0000 "application " | MEDICATED_PAD | CUTANEOUS | Status: DC | PRN

## 2012-03-05 MED ORDER — DIPHENHYDRAMINE HCL 25 MG PO CAPS
25.0000 mg | ORAL_CAPSULE | Freq: Four times a day (QID) | ORAL | Status: DC | PRN
Start: 2012-03-05 — End: 2012-03-08

## 2012-03-05 MED ORDER — ONDANSETRON HCL 4 MG/2ML IJ SOLN
INTRAMUSCULAR | Status: DC | PRN
  Administered 2012-03-05: 4 mg via INTRAVENOUS

## 2012-03-05 MED ORDER — BUPIVACAINE ON-Q PAIN PUMP (FOR ORDER SET NO CHG): INJECTION | Status: DC

## 2012-03-05 MED ORDER — ONDANSETRON HCL 4 MG PO TABS: 4.0000 mg | ORAL_TABLET | ORAL | Status: DC | PRN

## 2012-03-05 MED ORDER — FENTANYL CITRATE 0.05 MG/ML IJ SOLN
INTRAMUSCULAR | Status: DC | PRN
  Administered 2012-03-05: 25 ug via INTRATHECAL
  Administered 2012-03-05: 75 ug via INTRAVENOUS

## 2012-03-05 MED ORDER — LACTATED RINGERS IV SOLN
INTRAVENOUS | Status: DC | PRN
  Administered 2012-03-05: 06:00:00 via INTRAVENOUS

## 2012-03-05 MED ORDER — METOCLOPRAMIDE HCL 5 MG/ML IJ SOLN: 10.0000 mg | Freq: Three times a day (TID) | INTRAMUSCULAR | Status: DC | PRN

## 2012-03-05 MED ORDER — SCOPOLAMINE 1 MG/3DAYS TD PT72
1.0000 | MEDICATED_PATCH | Freq: Once | TRANSDERMAL | Status: AC
  Administered 2012-03-05: 1.5 mg via TRANSDERMAL

## 2012-03-05 MED ORDER — SENNOSIDES-DOCUSATE SODIUM 8.6-50 MG PO TABS
2.0000 | ORAL_TABLET | Freq: Every day | ORAL | Status: DC
  Administered 2012-03-05 – 2012-03-07 (×3): 2 via ORAL

## 2012-03-05 MED ORDER — LANOLIN HYDROUS EX OINT: 1.0000 "application " | TOPICAL_OINTMENT | CUTANEOUS | Status: DC | PRN

## 2012-03-05 MED ORDER — NALBUPHINE HCL 10 MG/ML IJ SOLN
5.0000 mg | INTRAMUSCULAR | Status: DC | PRN
  Administered 2012-03-05: 5 mg via INTRAVENOUS
  Filled 2012-03-05: qty 1

## 2012-03-05 MED ORDER — TETANUS-DIPHTH-ACELL PERTUSSIS 5-2.5-18.5 LF-MCG/0.5 IM SUSP: 0.5000 mL | Freq: Once | INTRAMUSCULAR | Status: DC

## 2012-03-05 MED ORDER — IBUPROFEN 600 MG PO TABS
600.0000 mg | ORAL_TABLET | Freq: Four times a day (QID) | ORAL | Status: DC
  Administered 2012-03-05 – 2012-03-08 (×12): 600 mg via ORAL
  Filled 2012-03-05 (×6): qty 1

## 2012-03-05 MED ORDER — OXYCODONE-ACETAMINOPHEN 5-325 MG PO TABS
1.0000 | ORAL_TABLET | ORAL | Status: DC | PRN
  Administered 2012-03-06 (×3): 1 via ORAL
  Administered 2012-03-07 (×3): 2 via ORAL
  Administered 2012-03-08: 1 via ORAL
  Filled 2012-03-05: qty 1
  Filled 2012-03-05: qty 2
  Filled 2012-03-05 (×2): qty 1
  Filled 2012-03-05 (×2): qty 2
  Filled 2012-03-05: qty 1

## 2012-03-05 MED ORDER — SODIUM CHLORIDE 0.9 % IV SOLN
1.0000 ug/kg/h | INTRAVENOUS | Status: DC | PRN
  Filled 2012-03-05: qty 2.5

## 2012-03-05 MED ORDER — INSULIN GLARGINE 100 UNIT/ML ~~LOC~~ SOLN
27.0000 [IU] | Freq: Every day | SUBCUTANEOUS | Status: DC
  Administered 2012-03-05: 27 [IU] via SUBCUTANEOUS

## 2012-03-05 MED ORDER — IBUPROFEN 600 MG PO TABS
600.0000 mg | ORAL_TABLET | Freq: Four times a day (QID) | ORAL | Status: DC | PRN
  Filled 2012-03-05 (×6): qty 1

## 2012-03-05 MED ORDER — CEFAZOLIN SODIUM-DEXTROSE 2-3 GM-% IV SOLR
INTRAVENOUS | Status: DC | PRN
  Administered 2012-03-05: 2 g via INTRAVENOUS

## 2012-03-05 MED ORDER — NALBUPHINE HCL 10 MG/ML IJ SOLN
5.0000 mg | INTRAMUSCULAR | Status: DC | PRN
  Filled 2012-03-05: qty 1

## 2012-03-05 MED ORDER — OXYTOCIN 40 UNITS IN LACTATED RINGERS INFUSION - SIMPLE MED: 62.5000 mL/h | INTRAVENOUS | Status: AC

## 2012-03-05 MED ORDER — HYDROMORPHONE HCL PF 1 MG/ML IJ SOLN: 0.2500 mg | INTRAMUSCULAR | Status: DC | PRN

## 2012-03-05 MED ORDER — LACTATED RINGERS IV SOLN
INTRAVENOUS | Status: DC
  Administered 2012-03-05: 10:00:00 via INTRAVENOUS

## 2012-03-05 MED ORDER — INSULIN ASPART 100 UNIT/ML ~~LOC~~ SOLN
0.0000 [IU] | Freq: Three times a day (TID) | SUBCUTANEOUS | Status: DC
Start: 2012-03-05 — End: 2012-03-05
  Administered 2012-03-05: 2 [IU] via SUBCUTANEOUS

## 2012-03-05 MED ORDER — INSULIN ASPART 100 UNIT/ML ~~LOC~~ SOLN
0.0000 [IU] | SUBCUTANEOUS | Status: DC
  Administered 2012-03-06: 4 [IU] via SUBCUTANEOUS
  Administered 2012-03-06: 16 [IU] via SUBCUTANEOUS
  Administered 2012-03-06: 8 [IU] via SUBCUTANEOUS
  Administered 2012-03-07: 12 [IU] via SUBCUTANEOUS
  Administered 2012-03-07 (×2): 8 [IU] via SUBCUTANEOUS

## 2012-03-05 MED ORDER — DIPHENHYDRAMINE HCL 50 MG/ML IJ SOLN: 25.0000 mg | INTRAMUSCULAR | Status: DC | PRN

## 2012-03-05 MED ORDER — FENTANYL CITRATE 0.05 MG/ML IJ SOLN
INTRAMUSCULAR | Status: AC
  Filled 2012-03-05: qty 2

## 2012-03-05 MED ORDER — SODIUM CHLORIDE 0.9 % IJ SOLN: 3.0000 mL | INTRAMUSCULAR | Status: DC | PRN

## 2012-03-05 MED ORDER — ONDANSETRON HCL 4 MG/2ML IJ SOLN: 4.0000 mg | Freq: Three times a day (TID) | INTRAMUSCULAR | Status: DC | PRN

## 2012-03-05 MED ORDER — PRENATAL MULTIVITAMIN CH
1.0000 | ORAL_TABLET | Freq: Every day | ORAL | Status: DC
  Administered 2012-03-06 – 2012-03-08 (×3): 1 via ORAL
  Filled 2012-03-05 (×3): qty 1

## 2012-03-05 MED ORDER — MORPHINE SULFATE 0.5 MG/ML IJ SOLN
INTRAMUSCULAR | Status: AC
  Filled 2012-03-05: qty 10

## 2012-03-05 MED ORDER — SIMETHICONE 80 MG PO CHEW
80.0000 mg | CHEWABLE_TABLET | Freq: Three times a day (TID) | ORAL | Status: DC
  Administered 2012-03-05 – 2012-03-08 (×10): 80 mg via ORAL

## 2012-03-05 MED ORDER — MENTHOL 3 MG MT LOZG: 1.0000 | LOZENGE | OROMUCOSAL | Status: DC | PRN

## 2012-03-05 MED ORDER — INSULIN ASPART 100 UNIT/ML ~~LOC~~ SOLN
0.0000 [IU] | Freq: Every day | SUBCUTANEOUS | Status: DC
  Administered 2012-03-05: 3 [IU] via SUBCUTANEOUS

## 2012-03-05 MED ORDER — DIPHENHYDRAMINE HCL 50 MG/ML IJ SOLN: 12.5000 mg | INTRAMUSCULAR | Status: DC | PRN

## 2012-03-05 SURGICAL SUPPLY — 37 items
ADH SKN CLS APL DERMABOND .7 (GAUZE/BANDAGES/DRESSINGS)
APL SKNCLS STERI-STRIP NONHPOA (GAUZE/BANDAGES/DRESSINGS)
BENZOIN TINCTURE PRP APPL 2/3 (GAUZE/BANDAGES/DRESSINGS) IMPLANT
CLOTH BEACON ORANGE TIMEOUT ST (SAFETY) ×2 IMPLANT
DERMABOND ADVANCED (GAUZE/BANDAGES/DRESSINGS)
DERMABOND ADVANCED .7 DNX12 (GAUZE/BANDAGES/DRESSINGS) IMPLANT
DRESSING TELFA 8X3 (GAUZE/BANDAGES/DRESSINGS) IMPLANT
DRSG COVADERM 4X10 (GAUZE/BANDAGES/DRESSINGS) ×1 IMPLANT
DURAPREP 26ML APPLICATOR (WOUND CARE) ×2 IMPLANT
ELECT REM PT RETURN 9FT ADLT (ELECTROSURGICAL) ×2
ELECTRODE REM PT RTRN 9FT ADLT (ELECTROSURGICAL) ×1 IMPLANT
EXTRACTOR VACUUM M CUP 4 TUBE (SUCTIONS) IMPLANT
GAUZE SPONGE 4X4 12PLY STRL LF (GAUZE/BANDAGES/DRESSINGS) IMPLANT
GLOVE BIO SURGEON STRL SZ 6.5 (GLOVE) ×2 IMPLANT
GLOVE BIOGEL PI IND STRL 7.0 (GLOVE) ×1 IMPLANT
GLOVE BIOGEL PI INDICATOR 7.0 (GLOVE) ×1
GOWN PREVENTION PLUS LG XLONG (DISPOSABLE) ×4 IMPLANT
GOWN PREVENTION PLUS XLARGE (GOWN DISPOSABLE) ×2 IMPLANT
KIT ABG SYR 3ML LUER SLIP (SYRINGE) ×1 IMPLANT
NEEDLE HYPO 25X5/8 SAFETYGLIDE (NEEDLE) ×2 IMPLANT
NS IRRIG 1000ML POUR BTL (IV SOLUTION) ×2 IMPLANT
PACK C SECTION WH (CUSTOM PROCEDURE TRAY) ×2 IMPLANT
PAD ABD 7.5X8 STRL (GAUZE/BANDAGES/DRESSINGS) ×1 IMPLANT
PAD OB MATERNITY 4.3X12.25 (PERSONAL CARE ITEMS) IMPLANT
RTRCTR C-SECT PINK 25CM LRG (MISCELLANEOUS) IMPLANT
SLEEVE SCD COMPRESS KNEE MED (MISCELLANEOUS) ×1 IMPLANT
STRIP CLOSURE SKIN 1/2X4 (GAUZE/BANDAGES/DRESSINGS) IMPLANT
SUT CHROMIC 2 0 CT 1 (SUTURE) ×4 IMPLANT
SUT PLAIN 2 0 (SUTURE)
SUT PLAIN 2 0 XLH (SUTURE) ×1 IMPLANT
SUT PLAIN ABS 2-0 54XMFL TIE (SUTURE) IMPLANT
SUT VIC AB 0 CT1 36 (SUTURE) ×8 IMPLANT
SUT VIC AB 4-0 KS 27 (SUTURE) ×2 IMPLANT
TAPE CLOTH SURG 4X10 WHT LF (GAUZE/BANDAGES/DRESSINGS) ×2 IMPLANT
TOWEL OR 17X24 6PK STRL BLUE (TOWEL DISPOSABLE) ×4 IMPLANT
TRAY FOLEY CATH 14FR (SET/KITS/TRAYS/PACK) IMPLANT
WATER STERILE IRR 1000ML POUR (IV SOLUTION) ×1 IMPLANT

## 2012-03-05 NOTE — Op Note (Addendum)
Cesarean Section Procedure Note   [name]   02/27/2012 - 03/05/2012  Indications: PPROM, PTL, hemorrhage, malposition, Class B insulin dependent diabetes, Group B Strep   Pre-operative Diagnosis:PPROM, Vaginal Bleeding, transverse lie with fetal foot in Vagina, GBS,  Class B diabetes  Post-operative Diagnosis: Same, nuchal cord x 1   Surgeon: Fortino Sic  Assistants: Dr Marice Potter  Anesthesia: Spinal  Procedure Details:  The patient was seen in the Room. The risks, benefits, complications, treatment options, and expected outcomes were discussed with the patient and her spouse. The patient concurred with the proposed plan, giving informed consent. The patient was identified as [name] and the procedure verified as C-Section Delivery. A Time Out was held and the above information confirmed.   After induction of anesthesia, the patient was draped and prepped in the usual sterile manner. A transverse incision was made and carried down through the subcutaneous tissue to the fascia. The fascial incision was made and extended transversely. The fascia was separated from the underlying rectus tissue superiorly and inferiorly. The peritoneum was identified and entered. The peritoneal incision was extended longitudinally. The utero-vesical peritoneal reflection was incised transversely and the bladder flap was bluntly freed from the lower uterine segment. A low transverse uterine incision was made. Delivered from transerse presentation by grasping both feet and extracting the fetus as a breech.  Delivered was a 1370 gram living newborn female infant with Apgar scores of 6,7 and,8 at 1, 5, and 10 minutes. There was a nuchal cord x 1.  Its was released as the baby was delivered.   A cord ph was sent. It was 7.2. The umbilical cord was clamped and cut cord. A sample was obtained for evaluation. The placenta was removed intact and appeared normal.  The uterine incision was closed with running locked  sutures of 1-0 Vicryl. A second imbricating layer of the same suture was placed.  Hemostasis was observed. The paracolic gutters were irrigated. The fascia was then reapproximated with running sutures of 1-0Vicryl. The peritoneum was closed with 3 0 chromic in a running fashion. The subcuticular closure was performed using 3-0 Plain .  The skin was closed with 4 0 vicryl on a Keith needle.  Instrument, sponge, and needle counts were correct prior the abdominal closure and were correct at the conclusion of the case.    Findings:   Estimated Blood Loss: 800cc   Total IV Fluids: Se anesthesia record   Urine Output: see anesthesia record  Specimens: Placenta to pathology   Complications: no complications  Disposition: PACU - hemodynamically stable.  Maternal Condition: stable   Baby condition / location:  NICU    Signed: Surgeon(s): Fortino Sic, MD

## 2012-03-05 NOTE — Progress Notes (Signed)
UR Chart review completed.  

## 2012-03-05 NOTE — Progress Notes (Signed)
Post operatively,  I discussed insulin orders with our Pharm D who recommended reducing Lantus insulin to 25 units at bedtime. and no Lantus in the mornings.  Moderate sliding scale was to be used with Novalog as needed based on glucose levels. .  Then I received call from Lenor Coffin, RN (diabetes coordinator) while in the OR about recommendations for insulin changes.  I returned her call after I got of surgery and left a message for Ms Chestine Spore to call me back.  Apparently she had left for the day.  I called the [facility] twice to discuss her understanding of proposed insulin changes.  The patient's nurse did not come to the phone either time. Orders changed tonight reflecting Ann's recommendations in note.

## 2012-03-05 NOTE — Anesthesia Postprocedure Evaluation (Signed)
Anesthesia Post Note  Patient: [name]  Procedure(s) Performed: Procedure(s) (LRB): CESAREAN SECTION (N/A)  Anesthesia type: Spinal  Patient location: PACU  Post pain: Pain level controlled  Post assessment: Post-op Vital signs reviewed  Last Vitals:  Filed Vitals:   03/05/12 0815  BP: 101/65  Pulse: 77  Temp:   Resp: 18    Post vital signs: Reviewed  Level of consciousness: awake  Complications: No apparent anesthesia complications

## 2012-03-05 NOTE — Progress Notes (Addendum)
Hospital Day: 8  S: Preterm labor symptoms: fluid leakage, vaginal discharge and heavy bleeding, dark.  Large blood clot passed starting around 2-3 am.  Then heavy bleeding resumed and started again when patient got up to void. then stopped and restarted. Patient complains of painful contractions every 10 minutes But contractions are not regularly showing up on the monitor,. O: Blood pressure 94/58, pulse 91, temperature 98.5 F (36.9 C), temperature source Oral, resp. rate 18, height 5' (1.524 m), weight 61.508 kg (135 lb 9.6 oz), last menstrual period 08/20/2011, SpO2 95.00%.   WUX:LKGMWNUU: 150 bpm, Variability: Good {> 6 bpm), Accelerations: Reactive and Decelerations: Variable: moderate, one episode of tachycardia in 180s for 10 minutes following passage of large blood clot.  Back to baselilne now. down to 90s, last 1-2 minutes with good recovery. Toco: Frequency: Every 10 minutes by history.  Moderate intensity palpation by me. Abdomen soft non tender except for contractions SVE: deferred   Fluid bolus has been given but intensity of contractions increasing.   HB 11.1 tonight (was 10)  A/P- 41 y.o. admitted with  PROM now, 28 2/7  WEEKS, Preterm labor, Class B diabetes  Bedside US  Kleihauer Betke to rule out fetal bleed T & C 2 units prbcs Insert foley catheter so patient won't have to get up to void. IV fluid bolus.  Patient Active Hospital Problem List: No active hospital problems.   Pregnancy Complications: PROM, PTL, Class B diabetes, Hemorrrhage  Preterm labor management: I  V hydration Dating:  [redacted]w[redacted]d   Addendum:  [place] transverse lie, with part of fetal  leg and foot in vagina now (new from 9/5 scan) within bag of fluid.   No retroplacental translucency or signs of abruption.  In light of new ultrasound findings and onset of labor and heavy vaginal bleeding will need to proceed with ultrasound. Discussed with patient and her husband findings and need to proceed with  CS.    45 minutes at bedside and floor assessing patient and counseling patient and her spouse on status and plans for delivery. NICU called.

## 2012-03-05 NOTE — Transfer of Care (Signed)
Immediate Anesthesia Transfer of Care Note  Patient: [name]  Procedure(s) Performed: Procedure(s) (LRB) with comments: CESAREAN SECTION (N/A)  Patient Location: PACU  Anesthesia Type: Spinal  Level of Consciousness: awake, alert , oriented and patient cooperative  Airway & Oxygen Therapy: Patient Spontanous Breathing  Post-op Assessment: Report given to PACU RN, Post -op Vital signs reviewed and stable and Patient moving all extremities X 4  Post vital signs: Reviewed and stable  Complications: No apparent anesthesia complications

## 2012-03-05 NOTE — Progress Notes (Addendum)
Hypoglycemic Event  CBG: 1230 54  Treatment: 15 GM carbohydrate snack  Symptoms: Shaky  Follow-up CBG: Time:1300 CBG Result:114  Possible Reasons for Event: Medication regimen: Lantus still in action from previous dose the HS prior  Comments/MD notified:New orders    Marya Landry  Remember to initiate Hypoglycemia Order Set & complete

## 2012-03-05 NOTE — Anesthesia Preprocedure Evaluation (Signed)
Anesthesia Evaluation  Patient identified by MRN, date of birth, ID band Patient awake    Reviewed: Allergy & Precautions, H&P , Patient's Chart, lab work & pertinent test results  Airway Mallampati: II TM Distance: >3 FB Neck ROM: full    Dental No notable dental hx.    Pulmonary  breath sounds clear to auscultation  Pulmonary exam normal       Cardiovascular Exercise Tolerance: Good Rhythm:regular Rate:Normal     Neuro/Psych    GI/Hepatic   Endo/Other  diabetes, Type 2, Insulin Dependent  Renal/GU      Musculoskeletal   Abdominal   Peds  Hematology   Anesthesia Other Findings   Reproductive/Obstetrics                           Anesthesia Physical Anesthesia Plan  ASA: III and Emergent  Anesthesia Plan: Spinal   Post-op Pain Management:    Induction:   Airway Management Planned:   Additional Equipment:   Intra-op Plan:   Post-operative Plan:   Informed Consent: I have reviewed the patients History and Physical, chart, labs and discussed the procedure including the risks, benefits and alternatives for the proposed anesthesia with the patient or authorized representative who has indicated his/her understanding and acceptance.   Dental Advisory Given  Plan Discussed with: CRNA  Anesthesia Plan Comments: (Lab work confirmed with CRNA in room. Platelets okay. Discussed spinal anesthetic, and patient consents to the procedure:  included risk of possible headache,backache, failed block, allergic reaction, and nerve injury. This patient was asked if she had any questions or concerns before the procedure started. )        Anesthesia Quick Evaluation

## 2012-03-05 NOTE — Anesthesia Procedure Notes (Signed)

## 2012-03-05 NOTE — Consult Note (Signed)
Inpatient Diabetes Program Recommendations  AACE/ADA: New Consensus Statement on Inpatient Glycemic Control (2013)  Target Ranges:  Prepandial:   less than 140 mg/dL      Peak postprandial:   less than 180 mg/dL (1-2 hours)      Critically ill patients:  140 - 180 mg/dL   Reason for Visit: consulted by nursing to assess potential medication needs. Was called this am and requested to watch patient's glucose throughout the day and talk to the patient when able regarding DM therapy prior to pregnancy. Pt has been on insulin for many years, but states she is a type 2.   Typically these patients do not require much insulin at all in the first 24-48 hours after delivery, as the pregnancy hormones are now gone and insulin sensitivity is high.  I recommend using the correction scale (moderate is ordered) q 4hrs (rather than tidwc and the HS scale) to assess how much insulin she may need at this time.   (Pt had low cbg of 54 this am due to the duration of action of the lantus given yesterday that was still in effect). This insulin is strictly a basal insulin and has nothing to do with her po intake.   If Lantus is given this evening, it will last for 24 hrs and may cause hypoglycemia possibly tonight and throughout the day tomorrow.  Using q 4 hr Novolog correction would be safer, as its duration of action is 2-4 hrs and the danger of hypoglycemia is tremendously lessened.  Can also assess how much basal insulin and meal coverage the patient will need at this time by using q 4hr correction. Have called Dr. Neva Seat with this recommendation. She states she will re-evaluate this afternoon.   Note: Please feel free to call if I can assist at all. Thank you, Lenor Coffin, RN, CNS, Diabetes Coordinator [phone])

## 2012-03-06 LAB — CBC
Platelets: 241 10*3/uL (ref 150–400)
RBC: 3.48 MIL/uL — ABNORMAL LOW (ref 3.87–5.11)
WBC: 14.9 10*3/uL — ABNORMAL HIGH (ref 4.0–10.5)

## 2012-03-06 LAB — GLUCOSE, CAPILLARY
Glucose-Capillary: 138 mg/dL — ABNORMAL HIGH (ref 70–99)
Glucose-Capillary: 61 mg/dL — ABNORMAL LOW (ref 70–99)
Glucose-Capillary: 172 mg/dL — ABNORMAL HIGH (ref 70–99)
Glucose-Capillary: 161 mg/dL — ABNORMAL HIGH (ref 70–99)

## 2012-03-06 MED ORDER — TETANUS-DIPHTH-ACELL PERTUSSIS 5-2.5-18.5 LF-MCG/0.5 IM SUSP
0.5000 mL | Freq: Once | INTRAMUSCULAR | Status: AC
  Administered 2012-03-07: 0.5 mL via INTRAMUSCULAR

## 2012-03-06 NOTE — Progress Notes (Signed)
Subjective: Postpartum Day 1: Cesarean Delivery Patient reports incisional pain, tolerating PO and no problems voiding.    Objective: Vital signs in last 24 hours: Temp:  [97.9 F (36.6 C)-98.5 F (36.9 C)] 98.1 F (36.7 C) (09/07 0554) Pulse Rate:  [69-91] 86  (09/07 0554) Resp:  [16-24] 16  (09/07 0554) BP: (84-103)/(56-69) 84/56 mmHg (09/07 0554) SpO2:  [96 %-100 %] 96 % (09/07 0554)  Physical Exam:  General: alert, cooperative and no distress Lochia: appropriate Uterine Fundus: firm and appropriately tender Incision: healing well, dressing dry DVT Evaluation: No evidence of DVT seen on physical exam. Negative Homan's sign. No cords or calf tenderness. No significant calf/ankle edema.   Basename 03/06/12 0615 03/05/12 0350  HGB 9.7* 11.1*  HCT 29.2* 33.1*   CBC    Component Value Date/Time   WBC 14.9* 03/06/2012 0615   RBC 3.48* 03/06/2012 0615   HGB 9.7* 03/06/2012 0615   HCT 29.2* 03/06/2012 0615   PLT 241 03/06/2012 0615   MCV 83.9 03/06/2012 0615   MCH 27.9 03/06/2012 0615   MCHC 33.2 03/06/2012 0615   RDW 15.3 03/06/2012 0615   LYMPHSABS 3.2 03/04/2012 0445   MONOABS 1.0 03/04/2012 0445   EOSABS 0.2 03/04/2012 0445   BASOSABS 0.0 03/04/2012 0445   Scheduled Meds:   . citric acid-sodium citrate      . ibuprofen  600 mg Oral Q6H  . insulin aspart  0-24 Units Subcutaneous Q2H   Followed by  . insulin aspart  0-24 Units Subcutaneous Q4H  . measles, mumps and rubella vaccine  0.5 mL Subcutaneous Once  . prenatal multivitamin  1 tablet Oral Daily  . scopolamine  1 patch Transdermal Once  . senna-docusate  2 tablet Oral QHS  . simethicone  80 mg Oral TID PC & HS  . TDaP  0.5 mL Intramuscular Once  . DISCONTD: amoxicillin  500 mg Oral Q8H  . DISCONTD: docusate sodium  100 mg Oral TID  . DISCONTD: ferrous sulfate  325 mg Oral TID WC  . DISCONTD: insulin aspart  0-15 Units Subcutaneous TID WC  . DISCONTD: insulin aspart  0-5 Units Subcutaneous QHS  . DISCONTD: insulin aspart  1-5  Units Subcutaneous QHS  . DISCONTD: insulin aspart  10-25 Units Subcutaneous QAC supper  . DISCONTD: insulin aspart  11-27 Units Subcutaneous QAC breakfast  . DISCONTD: insulin aspart  12-28 Units Subcutaneous QAC lunch  . DISCONTD: insulin glargine  27 Units Subcutaneous QHS  . DISCONTD: insulin glargine  30 Units Subcutaneous BH-q7a  . DISCONTD: insulin glargine  36 Units Subcutaneous QHS  . DISCONTD: lactated ringers  250 mL Intravenous Once  . DISCONTD: prenatal multivitamin  1 tablet Oral Daily  . DISCONTD: sodium chloride  3 mL Intravenous Q12H   Continuous Infusions:   . lactated ringers 125 mL/hr at 03/05/12 1017  . naloxone (NARCAN) infusion    . oxytocin 40 units in LR 1000 mL    . DISCONTD: bupivacaine ON-Q pain pump    . DISCONTD: lactated ringers     PRN Meds:.dibucaine, diphenhydrAMINE, diphenhydrAMINE, diphenhydrAMINE, diphenhydrAMINE, ibuprofen, ketorolac, ketorolac, lanolin, medroxyPROGESTERone, menthol-cetylpyridinium, metoCLOPramide (REGLAN) injection, nalbuphine, nalbuphine, naloxone (NARCAN) infusion, naloxone, ondansetron (ZOFRAN) IV, ondansetron (ZOFRAN) IV, ondansetron, oxyCODONE-acetaminophen, simethicone, sodium chloride, witch hazel-glycerin, zolpidem, DISCONTD: acetaminophen DISCONTD: calcium carbonate, DISCONTD: glucose-Vitamin C, DISCONTD:  HYDROmorphone (DILAUDID) injection, DISCONTD: meperidine (DEMEROL) injection, DISCONTD: polyethylene glycol, DISCONTD: zolpidem  Assessment/Plan: Status post Cesarean section. Doing well postoperatively.  Class B DM Continue current care. Will normalize glucose routine  [name]. 03/06/2012, 8:13 AM

## 2012-03-06 NOTE — Addendum Note (Signed)
Addendum  created 03/06/12 1610 by Len Blalock, CRNA   Modules edited:Notes Section

## 2012-03-06 NOTE — Anesthesia Postprocedure Evaluation (Signed)
  Anesthesia Post-op Note  Patient: [name]  Procedure(s) Performed: Procedure(s) (LRB) with comments: CESAREAN SECTION (N/A)  Patient Location: PACU and Women's Unit  Anesthesia Type: Epidural  Level of Consciousness: awake, alert  and oriented  Airway and Oxygen Therapy: Patient Spontanous Breathing    Post-op Assessment: Patient's Cardiovascular Status Stable and Respiratory Function Stable  Post-op Vital Signs: stable  Complications: No apparent anesthesia complications

## 2012-03-06 NOTE — Addendum Note (Signed)
Addendum  created 03/06/12 1609 by Keller Mikels D Kandy Towery, CRNA   Modules edited:Notes Section    

## 2012-03-06 NOTE — Anesthesia Postprocedure Evaluation (Signed)
  Anesthesia Post-op Note  Patient: [name]  Procedure(s) Performed: Procedure(s) (LRB) with comments: CESAREAN SECTION (N/A)  Patient Location: PACU and Women's Unit  Anesthesia Type: Epidural  Level of Consciousness: awake, alert  and oriented  Airway and Oxygen Therapy: Patient Spontanous Breathing    Post-op Assessment: Patient's Cardiovascular Status Stable and Respiratory Function Stable  Post-op Vital Signs: stable  Complications: No apparent anesthesia complications 

## 2012-03-06 NOTE — Clinical Social Work Note (Signed)
Clinical Social Work Department PSYCHOSOCIAL ASSESSMENT - MATERNAL/CHILD Name: [name] Age: 41 day Referral Date: 03/05/12 Referral source: NICU Referral reason:  NICU  I. FAMILY/HOME ENVIRONMENT  Child's legal guardian: Parent(s) [name] parent    DSS  Name:  [name] Age:  41 y/o Address: [address], [place], [place]  [address] Name:  [name] Age: Address:  Same as above   Other Household Members/Support Persons Relationship  Daughter DOB  2 y/o                   Relationship  Son                   DOB   73 y/o                   Relationship  Son                   DOB  78 y/o                   Relationship Daughter        DOB  6 C.   Other Support   II. PSYCHOSOCIAL DATA A. Information Source  Patient Interview X  Family Interview           Other B. 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 STRENGTHS  Supportive family/friends Yes  Adequate Resources   Yes Compliance with medical plan  Home prepared for Child (including basic supplies)  Yes Understanding of illness  Yes         Other  IV. RISK FACTORS AND CURRENT PROBLEMS V. No Problems Noted VI. Substance Abuse                                           Pt Family VII. Mental Illness     Pt Family               Family/Relationship Issues   Pt Family      Abuse/Neglect/Domestic Violence   Pt Family   Financial Resources     Pt Family  Transportation     Pt Family  DSS Involvement    Pt Family  Adjustment to Illness    Pt Family   Knowledge/Cognitive Deficit   Pt Family   Compliance with Treatment   Pt Family   Basic  Needs (food, housing, etc)  Pt Family  Housing Concerns    Pt Family  Other             VIII. SOCIAL WORK ASSESSMENT SW received referral due to NICU admission.  The baby is [redacted] week gestation.  Pt expressed some difficulty in recovering from c-section.  She stated she is receiving adequate  medical information regarding her son from the staff.  None of her other children were in the NICU and they were all vaginal.  She stated she never told her other children that she was pregnant.  She does not currently have supplies at home, but will purchase then prior to baby being discharged from the hospital.  Pt also stated she was worried about breast feeding since she is expressing minimal amounts.  The Lactation Consultant continues working with pt to build up her milk supply.  She has Medicaid.  She does not work out of the home.  She was very open with SW about her delivery and concerns for the baby.  There is a note on the pt's door stating no female visitors or staff which is a cultural concern.  Pt will require continues support and assessment of needs.  IX. SOCIAL WORK PLAN (In [place]) No Further Intervention Required/No Barriers to Discharge Psychosocial Support and Ongoing Assessment of Needs Patient/Family  Education Child Protective Services Report  [place]        Date Information/Referral to [facility]   Other

## 2012-03-06 NOTE — Plan of Care (Signed)
Problem: Phase I Progression Outcomes Goal: Pain controlled with appropriate interventions Outcome: Completed/Met Date Met:  03/06/12 Patient has not wanted anything but Motrin for pain at this point and refuses anything stronger Goal: Voiding adequately Outcome: Completed/Met Date Met:  03/06/12 Voided 250 cc clear yellow urine 2 hours after Foley was d/c'd Goal: Other Phase I Outcomes/Goals Outcome: Not Progressing Glycemic control.Patients sliding scale and cbg checks were changed to every 2 hours x 3 with coverage and her last cbg is down to 138.  Problem: Phase II Progression Outcomes Goal: Progress activity as tolerated unless otherwise ordered Outcome: Completed/Met Date Met:  03/06/12 Patient has tolerated walking to and from bathroom. Goal: Incision intact & without signs/symptoms of infection Outcome: Not Applicable Date Met:  03/06/12 Drsg on CDI Goal: Tolerating diet Outcome: Completed/Met Date Met:  03/06/12 Patient is on a mod carb diet but eats food from home as well.

## 2012-03-07 LAB — GLUCOSE, CAPILLARY
Glucose-Capillary: 228 mg/dL — ABNORMAL HIGH (ref 70–99)
Glucose-Capillary: 165 mg/dL — ABNORMAL HIGH (ref 70–99)
Glucose-Capillary: 229 mg/dL — ABNORMAL HIGH (ref 70–99)
Glucose-Capillary: 179 mg/dL — ABNORMAL HIGH (ref 70–99)
Glucose-Capillary: 230 mg/dL — ABNORMAL HIGH (ref 70–99)

## 2012-03-07 MED ORDER — INSULIN ASPART 100 UNIT/ML ~~LOC~~ SOLN
0.0000 [IU] | Freq: Three times a day (TID) | SUBCUTANEOUS | Status: DC
  Administered 2012-03-07: 8 [IU] via SUBCUTANEOUS
  Administered 2012-03-08: 2 [IU] via SUBCUTANEOUS

## 2012-03-07 MED ORDER — INSULIN GLARGINE 100 UNIT/ML ~~LOC~~ SOLN
20.0000 [IU] | Freq: Two times a day (BID) | SUBCUTANEOUS | Status: DC
  Administered 2012-03-07 – 2012-03-08 (×3): 20 [IU] via SUBCUTANEOUS

## 2012-03-07 NOTE — Plan of Care (Signed)
Problem: Phase II Progression Outcomes Goal: Other Phase II Outcomes/Goals Outcome: Completed/Met Date Met:  03/07/12 Patient is up and ambulating and not lying in bed as much  Problem: Discharge Progression Outcomes Goal: Activity appropriate for discharge plan Outcome: Completed/Met Date Met:  03/07/12 Ambulates well and sits up in the chair instead of lying in bed Goal: Tolerating diet Outcome: Completed/Met Date Met:  03/07/12 Needs to try to remember to make good food choices Goal: Pain controlled with appropriate interventions Outcome: Completed/Met Date Met:  03/07/12 Adequate pain relief when [name] will take her Percocet

## 2012-03-07 NOTE — Progress Notes (Signed)
Subjective: Postpartum Day 2: Cesarean Delivery Patient reports incisional pain, tolerating PO and no problems voiding.    Objective: Vital signs in last 24 hours: Temp:  [98 F (36.7 C)-98.1 F (36.7 C)] 98 F (36.7 C) (09/08 0615) Pulse Rate:  [80-88] 80  (09/08 0615) Resp:  [16-22] 16  (09/08 0615) BP: (92-105)/(55-76) 94/55 mmHg (09/08 0615) SpO2:  [97 %-99 %] 99 % (09/08 0615)  Physical Exam:  General: alert, cooperative and no distress Lochia: appropriate Uterine Fundus: firm and appropriately tender Incision: healing well, no significant drainage, and serous stain on dressing dry and no expressible as the pt allowed only minimal removal of the bandage but enough to see there is no drainage or sign of infection DVT Evaluation: No evidence of DVT seen on physical exam. Negative Homan's sign. No cords or calf tenderness. No significant calf/ankle edema.   Basename 03/06/12 0615 03/05/12 0350  HGB 9.7* 11.1*  HCT 29.2* 33.1*    Assessment/Plan: Status post Cesarean section. Doing well postoperatively.  Class B DM with reasonable post partum glucose considering non compliance with diet as pt wants to go home tomorrow and will resume her previous care Continue current care.  Verdie Barrows H. 03/07/2012, 8:41 AM

## 2012-03-08 ENCOUNTER — Ambulatory Visit (HOSPITAL_COMMUNITY): Payer: Medicaid Other

## 2012-03-08 ENCOUNTER — Encounter (HOSPITAL_COMMUNITY): Payer: Self-pay | Admitting: Obstetrics and Gynecology

## 2012-03-08 LAB — GLUCOSE, CAPILLARY

## 2012-03-08 MED ORDER — INSULIN ASPART 100 UNIT/ML ~~LOC~~ SOLN: SUBCUTANEOUS | Status: DC

## 2012-03-08 MED ORDER — INSULIN GLARGINE 100 UNIT/ML ~~LOC~~ SOLN: 30.0000 [IU] | Freq: Two times a day (BID) | SUBCUTANEOUS | Status: DC

## 2012-03-08 MED ORDER — PNEUMOCOCCAL VAC POLYVALENT 25 MCG/0.5ML IJ INJ
0.5000 mL | INJECTION | Freq: Once | INTRAMUSCULAR | Status: AC
  Administered 2012-03-08: 0.5 mL via INTRAMUSCULAR
  Filled 2012-03-08: qty 0.5

## 2012-03-08 NOTE — Discharge Summary (Signed)
Obstetric Discharge Summary Reason for Admission: rupture of membranes at 27 2/7 weeks Prenatal Procedures: ultrasound Intrapartum Procedures: cesarean: low cervical, transverse Postpartum Procedures: none Complications-Operative and Postpartum: diabetes  Hemoglobin  Date Value Range Status  03/06/2012 9.7* 12.0 - 15.0 g/dL Final     HCT  Date Value Range Status  03/06/2012 29.2* 36.0 - 46.0 % Final    Discharge Diagnoses: Antepartum bleeding, Premature labor and prolonged ( 7 days) premature rupture of membranes  Discharge Information: Date: 03/08/2012 Activity: pelvic rest Diet: routine Medications: PNV, Iron and Percocet Condition: stable Instructions: refer to practice specific booklet Discharge to: home   Newborn Data: Live born  Information for the patient's newborn:  Sol[place], Heron [[id number]]  female ; APGAR , ; weight ;  Home with mother.  Tommy Minichiello E 03/08/2012, 2:52 PM

## 2012-03-08 NOTE — Progress Notes (Signed)
Ur chart review completed.  

## 2012-03-08 NOTE — Progress Notes (Signed)
Subjective: Postpartum Day 4: Cesarean Delivery Patient reports tolerating PO and no problems voiding.    Objective: Vital signs in last 24 hours: Temp:  [98 F (36.7 C)-98.3 F (36.8 C)] 98.1 F (36.7 C) (09/09 1152) Pulse Rate:  [84-93] 93  (09/09 1152) Resp:  [16-20] 16  (09/09 1152) BP: (97-127)/(63-74) 127/74 mmHg (09/09 1152) SpO2:  [95 %-97 %] 95 % (09/09 1152)  Physical Exam:  General: alert and cooperative Lochia: appropriate Uterine Fundus: firm Incision: healing well DVT Evaluation: No evidence of DVT seen on physical exam.   Basename 03/06/12 0615  HGB 9.7*  HCT 29.2*    Assessment/Plan: Status post Cesarean section. Doing well postoperatively.  Discharge home with standard precautions and return to clinic in 4-6 weeks.  Incision check in 2 weeks  Anemia  P:  Prenatal vitamins, iron  Diabetes:  Patient and spouse instructed to call Dr Eliane Decree nurse to get correct post delivery doses of insulin today. They said they will call.  Pennelope Basque E 03/08/2012, 2:48 PM

## 2012-03-08 NOTE — Progress Notes (Signed)
This was a follow-up visit with [name] and her husband who were seen by Caro Hight during their stay on antenatal.  They were in good spirits and reported that the baby was doing well.  They have had to alter some of their rituals around welcoming babies in their community because of the baby's stay in the NICU.  They are coping well with the situation emotionally, though they are experiencing some stress with dealing with the logistics of medicaid and other paperwork.  I provided emotional support and compassionate listening.    We will continue to follow this family in the NICU, but please page as needs arise, [phone].  Chaplain Katy Sairah Knobloch 10:30 a.m.   03/08/12 1300  Clinical Encounter Type  Visited With Patient and family together  Visit Type Follow-up;Spiritual support  Spiritual Encounters  Spiritual Needs Emotional

## 2012-03-09 LAB — TYPE AND SCREEN
Antibody Screen: NEGATIVE
Unit division: 0
Unit division: 0
Unit division: 0

## 2012-03-11 ENCOUNTER — Other Ambulatory Visit (HOSPITAL_COMMUNITY): Payer: Medicaid Other

## 2012-03-16 ENCOUNTER — Encounter (HOSPITAL_COMMUNITY): Payer: Self-pay

## 2012-03-16 ENCOUNTER — Inpatient Hospital Stay (HOSPITAL_COMMUNITY): Payer: Medicaid Other

## 2012-03-16 ENCOUNTER — Other Ambulatory Visit: Payer: Self-pay | Admitting: Obstetrics and Gynecology

## 2012-03-16 ENCOUNTER — Inpatient Hospital Stay (HOSPITAL_COMMUNITY)
Admission: AD | Admit: 2012-03-16 | Discharge: 2012-03-19 | DRG: 776 | Disposition: A | Discharge status: Home / Self Care | Payer: Medicaid Other | Source: Ambulatory Visit | Attending: Obstetrics & Gynecology | Admitting: Obstetrics & Gynecology

## 2012-03-16 DIAGNOSIS — E119 Type 2 diabetes mellitus without complications: Secondary | ICD-10-CM | POA: Diagnosis present

## 2012-03-16 DIAGNOSIS — O2493 Unspecified diabetes mellitus in the puerperium: Secondary | ICD-10-CM | POA: Diagnosis present

## 2012-03-16 DIAGNOSIS — O8689 Other specified puerperal infections: Secondary | ICD-10-CM

## 2012-03-16 DIAGNOSIS — O8612 Endometritis following delivery: Principal | ICD-10-CM | POA: Diagnosis present

## 2012-03-16 LAB — CBC WITH DIFFERENTIAL/PLATELET
Basophils Absolute: 0 10*3/uL (ref 0.0–0.1)
Basophils Relative: 0 % (ref 0–1)
Eosinophils Absolute: 0.2 10*3/uL (ref 0.0–0.7)
Eosinophils Relative: 2 % (ref 0–5)
HCT: 33.8 % — ABNORMAL LOW (ref 36.0–46.0)
Hemoglobin: 11.1 g/dL — ABNORMAL LOW (ref 12.0–15.0)
Lymphocytes Relative: 42 % (ref 12–46)
Lymphs Abs: 4.6 10*3/uL — ABNORMAL HIGH (ref 0.7–4.0)
MCH: 27.4 pg (ref 26.0–34.0)
MCHC: 32.8 g/dL (ref 30.0–36.0)
MCV: 83.5 fL (ref 78.0–100.0)
Monocytes Absolute: 0.9 10*3/uL (ref 0.1–1.0)
Monocytes Relative: 8 % (ref 3–12)
Neutro Abs: 5.3 10*3/uL (ref 1.7–7.7)
Neutrophils Relative %: 48 % (ref 43–77)
Platelets: 374 10*3/uL (ref 150–400)
RBC: 4.05 MIL/uL (ref 3.87–5.11)
RDW: 15.1 % (ref 11.5–15.5)
WBC: 10.9 10*3/uL — ABNORMAL HIGH (ref 4.0–10.5)

## 2012-03-16 LAB — GLUCOSE, CAPILLARY
Glucose-Capillary: 78 mg/dL (ref 70–99)
Glucose-Capillary: 224 mg/dL — ABNORMAL HIGH (ref 70–99)
Glucose-Capillary: 140 mg/dL — ABNORMAL HIGH (ref 70–99)
Glucose-Capillary: 155 mg/dL — ABNORMAL HIGH (ref 70–99)

## 2012-03-16 LAB — COMPREHENSIVE METABOLIC PANEL
ALT: 29 U/L (ref 0–35)
AST: 21 U/L (ref 0–37)
Albumin: 2.9 g/dL — ABNORMAL LOW (ref 3.5–5.2)
CO2: 25 mEq/L (ref 19–32)
Calcium: 9.6 mg/dL (ref 8.4–10.5)
Creatinine, Ser: 0.5 mg/dL (ref 0.50–1.10)
GFR calc non Af Amer: >90 mL/min (ref >90)
Sodium: 140 mEq/L (ref 135–145)
Total Protein: 7 g/dL (ref 6.0–8.3)

## 2012-03-16 MED ORDER — INSULIN ASPART 100 UNIT/ML ~~LOC~~ SOLN
9.0000 [IU] | Freq: Once | SUBCUTANEOUS | Status: AC
  Administered 2012-03-16: 9 [IU] via SUBCUTANEOUS

## 2012-03-16 MED ORDER — ZOLPIDEM TARTRATE 5 MG PO TABS: 5.0000 mg | ORAL_TABLET | Freq: Every evening | ORAL | Status: DC | PRN

## 2012-03-16 MED ORDER — DOCUSATE SODIUM 100 MG PO CAPS
100.0000 mg | ORAL_CAPSULE | Freq: Two times a day (BID) | ORAL | Status: DC
  Administered 2012-03-16 – 2012-03-19 (×7): 100 mg via ORAL
  Filled 2012-03-16 (×7): qty 1

## 2012-03-16 MED ORDER — PRENATAL MULTIVITAMIN CH: 1.0000 | ORAL_TABLET | Freq: Every day | ORAL | Status: DC

## 2012-03-16 MED ORDER — INSULIN GLARGINE 100 UNIT/ML ~~LOC~~ SOLN
25.0000 [IU] | Freq: Two times a day (BID) | SUBCUTANEOUS | Status: DC
  Administered 2012-03-16 – 2012-03-18 (×3): 25 [IU] via SUBCUTANEOUS
  Filled 2012-03-16: qty 3

## 2012-03-16 MED ORDER — SODIUM CHLORIDE 0.9 % IV SOLN
3.0000 g | Freq: Four times a day (QID) | INTRAVENOUS | Status: DC
  Administered 2012-03-16 – 2012-03-19 (×12): 3 g via INTRAVENOUS
  Filled 2012-03-16 (×15): qty 3

## 2012-03-16 MED ORDER — SODIUM CHLORIDE 0.9 % IV SOLN
INTRAVENOUS | Status: DC
  Administered 2012-03-16 – 2012-03-17 (×2): via INTRAVENOUS

## 2012-03-16 MED ORDER — INSULIN GLARGINE 100 UNIT/ML ~~LOC~~ SOLN: 25.0000 [IU] | SUBCUTANEOUS | Status: DC

## 2012-03-16 MED ORDER — ONDANSETRON HCL 4 MG/2ML IJ SOLN: 4.0000 mg | Freq: Four times a day (QID) | INTRAMUSCULAR | Status: DC | PRN

## 2012-03-16 MED ORDER — IBUPROFEN 800 MG PO TABS
800.0000 mg | ORAL_TABLET | Freq: Three times a day (TID) | ORAL | Status: DC | PRN
  Administered 2012-03-16 – 2012-03-19 (×5): 800 mg via ORAL
  Filled 2012-03-16 (×5): qty 1

## 2012-03-16 MED ORDER — PRENATAL MULTIVITAMIN CH
1.0000 | ORAL_TABLET | Freq: Every day | ORAL | Status: DC
  Administered 2012-03-16 – 2012-03-19 (×4): 1 via ORAL
  Filled 2012-03-16 (×4): qty 1

## 2012-03-16 MED ORDER — INSULIN ASPART 100 UNIT/ML ~~LOC~~ SOLN
0.0000 [IU] | Freq: Three times a day (TID) | SUBCUTANEOUS | Status: DC
  Administered 2012-03-16: 16 [IU] via SUBCUTANEOUS
  Administered 2012-03-16: 18 [IU] via SUBCUTANEOUS
  Administered 2012-03-17: 16 [IU] via SUBCUTANEOUS
  Administered 2012-03-17: 9 [IU] via SUBCUTANEOUS
  Administered 2012-03-17: 17 [IU] via SUBCUTANEOUS
  Administered 2012-03-18: 15 [IU] via SUBCUTANEOUS

## 2012-03-16 MED ORDER — ALUM & MAG HYDROXIDE-SIMETH 200-200-20 MG/5ML PO SUSP: 30.0000 mL | ORAL | Status: DC | PRN

## 2012-03-16 MED ORDER — ONDANSETRON HCL 4 MG PO TABS: 4.0000 mg | ORAL_TABLET | Freq: Four times a day (QID) | ORAL | Status: DC | PRN

## 2012-03-16 MED ORDER — OXYCODONE-ACETAMINOPHEN 5-325 MG PO TABS
1.0000 | ORAL_TABLET | ORAL | Status: DC | PRN
  Administered 2012-03-17 – 2012-03-19 (×6): 1 via ORAL
  Filled 2012-03-16 (×6): qty 1

## 2012-03-16 NOTE — MAU Note (Signed)
Patient for direct admit due to postpartum fever. Patient delivered by C/S on 9/6, PPROM. Baby in NICU, doing well.

## 2012-03-16 NOTE — H&P (Signed)
[name] is an 41 y.o. female  Gravida 6, para 6, who is 11 days post CS for prolonged prematurely ruptured membranes, bleeding, positive group B strep, transverse lie at 28+ weeks.  She presents with a two-day history of fever or chills .  She was in the office on yesterday and a CBC was obtained and came back today with a white count of 25,000, therefore, she was called to come in for admission to the hospital.  Urine culture was sent on September 16 Pertinent Gynecological History: Menses: postpartum Bleeding: normal postpartum Contraception: na  Blood transfusions:  Sexually transmitted disease  No history      Menstrual History: Patient's last menstrual period was 08/20/2011.    Past Medical History  Diagnosis Date  . Type 2 diabetes mellitus     Past Surgical History  Procedure Date  . Cesarean section 03/05/2012    Procedure: CESAREAN SECTION;  Surgeon: Fortino Sic, MD;  Location: [facility];  Service: Obstetrics;  Laterality: N/A;    No family history on file.  Social History:  reports that she has never smoked. She does not have any smokeless tobacco history on file. She reports that she does not drink alcohol or use illicit drugs.  Allergies: No Known Allergies   (Not in a hospital admission)  Last menstrual period 08/20/2011, unknown if currently breastfeeding.  No results found for this or any previous visit (from the past 24 hour(s)).  No results found.  Review of systems as noted.  Physical exam vital signs are stable.  Temperature is 98.6 at the patient states that her temperature was 102 last night. HEENT exam normal Chest is clear to auscultation and percussion  Heart S1 and S2 is clear Bowel sounds are present Incision is normal in appearance and appears to be healing. To the left of the incision s a small  3cmarea of ecchymosis, but is not enlarged or exquisitely tender In the right I'm sorry in the left lower quadrant.  There is some tenderness  to deep palpation of the abdomen  No CVA tenderness Breast exam normal without induration and discoloration of hardness  Extremities are normal without swelling   Pelvic exam  Uterus is normal postpartum size Unable to appreciate any adnexal swelling or tenderness Cervix is normal.  There is normal postpartum bleeding noted A cervical culture was sent  Assessment/Plan:  41 year old parous female 11 days postpartum with peripheral fever and elevated white count Urine culture and cervical culture is pending from the office Repeat CBC, CMP  Class B. diabetes  IV Unasyn p o Flagyl  Check diabetes control  Sent blood culture if temperature goes over 101 in the hospital  [name] E 03/16/2012, 12:34 PM

## 2012-03-17 ENCOUNTER — Encounter (HOSPITAL_COMMUNITY): Payer: Self-pay | Admitting: *Deleted

## 2012-03-17 LAB — GLUCOSE, CAPILLARY
Glucose-Capillary: 147 mg/dL — ABNORMAL HIGH (ref 70–99)
Glucose-Capillary: 58 mg/dL — ABNORMAL LOW (ref 70–99)
Glucose-Capillary: 58 mg/dL — ABNORMAL LOW (ref 70–99)

## 2012-03-17 MED ORDER — INSULIN GLARGINE 100 UNIT/ML ~~LOC~~ SOLN
10.0000 [IU] | Freq: Once | SUBCUTANEOUS | Status: AC
  Administered 2012-03-17: 10 [IU] via SUBCUTANEOUS

## 2012-03-17 NOTE — Progress Notes (Addendum)
Subjective: Patient reports tolerating PO, + BM and no problems voiding.  And states she is starting to feel better denies breast pain -back pain - leg pain  Objective: I have reviewed patient's vital signs, medications and radiology results.  General: alert, cooperative and no distress Resp: clear to auscultation bilaterally Cardio: regular rate and rhythm, S1, S2 normal, no murmur, click, rub or gallop GI: soft, non-tender; bowel sounds normal; no masses,  no organomegaly, incision: clean, dry, intact and without any problems there is a slight ecchymotic area above the left 1/3 but there is no evidence of seroma or wound infection and   Extremities: extremities normal, atraumatic, no cyanosis or edema and Homans sign is negative, no sign of DVT Fundus point of maximal tenderness   [place] 03-16-12 revealed 6.6 mm endometrial thickness heterogenous echo     CBC    Component Value Date/Time   WBC 10.9* 03/16/2012 1325   RBC 4.05 03/16/2012 1325   HGB 11.1* 03/16/2012 1325   HCT 33.8* 03/16/2012 1325   PLT 374 03/16/2012 1325   MCV 83.5 03/16/2012 1325   MCH 27.4 03/16/2012 1325   MCHC 32.8 03/16/2012 1325   RDW 15.1 03/16/2012 1325   LYMPHSABS 4.6* 03/16/2012 1325   MONOABS 0.9 03/16/2012 1325   EOSABS 0.2 03/16/2012 1325   BASOSABS 0.0 03/16/2012 1325   CMP     Component Value Date/Time   NA 140 03/16/2012 1325   K 4.0 03/16/2012 1325   CL 105 03/16/2012 1325   CO2 25 03/16/2012 1325   GLUCOSE 64* 03/16/2012 1325   BUN 18 03/16/2012 1325   CREATININE 0.50 03/16/2012 1325   CREATININE 0.54 03/04/2012 1030   CALCIUM 9.6 03/16/2012 1325   PROT 7.0 03/16/2012 1325   ALBUMIN 2.9* 03/16/2012 1325   AST 21 03/16/2012 1325   ALT 29 03/16/2012 1325   ALKPHOS 89 03/16/2012 1325   BILITOT 0.2* 03/16/2012 1325   GFRNONAA >90 03/16/2012 1325   GFRAA >90 03/16/2012 1325   Scheduled Meds:   . ampicillin-sulbactam (UNASYN) IV  3 g Intravenous Q6H  . docusate sodium  100 mg Oral BID  . insulin aspart  0-23  Units Subcutaneous TID AC & HS  . insulin glargine  25 Units Subcutaneous BID  . prenatal multivitamin  1 tablet Oral Daily   Continuous Infusions:   . sodium chloride 20 mL/hr at 03/16/12 1400   PRN Meds:.alum & mag hydroxide-simeth, ibuprofen, ondansetron (ZOFRAN) IV, ondansetron, oxyCODONE-acetaminophen, zolpidem  Temp:  [97.5 F (36.4 C)-98.3 F (36.8 C)] 98.1 F (36.7 C) (09/18 1520) Pulse Rate:  [57-79] 64  (09/18 1520) Resp:  [16-20] 20  (09/18 1520) BP: (98-126)/(52-70) 102/52 mmHg (09/18 1520) SpO2:  [96 %-100 %] 99 % (09/18 1520) Weight:  [57.834 kg (127 lb 8 oz)] 57.834 kg (127 lb 8 oz) (09/18 0509) Assessment/Plan: Pp metritis  Continue current care with unasyn   LOS: 1 day    Garnett Rekowski H. 03/17/2012, 5:26 PM

## 2012-03-17 NOTE — Progress Notes (Signed)
Hypoglycemic Event  CBG: 58  Treatment: 1/2 cup (4 oz) apple juice  Symptoms: asymptomatic  Follow-up CBG: Time:2230 CBG Result:58  Treatment:  1 cup (8 oz) skim milk  Symptoms:  Asymptomatic  Follow-up CBG:  Time:2306   CBG Result:98  Possible Reasons for Event: Patient states she did not eat any bread or many carbs today  Comments/MD notified:Dr. Richardson Dopp was notified at 2310 and ordered 10 units of lantus to be given instead of the normal 25 units.  Dr. Richardson Dopp asked for the [facility] to be rechecked at 0300.    Juel Burrow  Remember to initiate Hypoglycemia Order Set & complete

## 2012-03-17 NOTE — Progress Notes (Signed)
Ur chart review completed.  

## 2012-03-18 LAB — GLUCOSE, CAPILLARY
Glucose-Capillary: 150 mg/dL — ABNORMAL HIGH (ref 70–99)
Glucose-Capillary: 103 mg/dL — ABNORMAL HIGH (ref 70–99)
Glucose-Capillary: 31 mg/dL — CL (ref 70–99)
Glucose-Capillary: 232 mg/dL — ABNORMAL HIGH (ref 70–99)
Glucose-Capillary: 347 mg/dL — ABNORMAL HIGH (ref 70–99)
Glucose-Capillary: 203 mg/dL — ABNORMAL HIGH (ref 70–99)

## 2012-03-18 MED ORDER — INSULIN ASPART 100 UNIT/ML ~~LOC~~ SOLN
0.0000 [IU] | Freq: Three times a day (TID) | SUBCUTANEOUS | Status: DC
  Administered 2012-03-19: 5 [IU] via SUBCUTANEOUS

## 2012-03-18 MED ORDER — INSULIN GLARGINE 100 UNIT/ML ~~LOC~~ SOLN
10.0000 [IU] | Freq: Once | SUBCUTANEOUS | Status: AC
  Administered 2012-03-18: 10 [IU] via SUBCUTANEOUS

## 2012-03-18 MED ORDER — INSULIN ASPART 100 UNIT/ML ~~LOC~~ SOLN
9.0000 [IU] | Freq: Once | SUBCUTANEOUS | Status: AC
  Administered 2012-03-18: 9 [IU] via SUBCUTANEOUS

## 2012-03-18 MED ORDER — GLUCOSE 40 % PO GEL
ORAL | Status: AC
  Administered 2012-03-18: 31 g
  Filled 2012-03-18: qty 0.83

## 2012-03-18 MED ORDER — INSULIN ASPART 100 UNIT/ML ~~LOC~~ SOLN
0.0000 [IU] | Freq: Every day | SUBCUTANEOUS | Status: DC
  Administered 2012-03-18: 2 [IU] via SUBCUTANEOUS

## 2012-03-18 NOTE — Progress Notes (Signed)
Per Dr. Christell Constant, we will d/c current insulin orders, check pts cbg every 4 hours and contact md if cbg is >200.

## 2012-03-18 NOTE — Progress Notes (Addendum)
Subjective: Patient reports nausea, vomiting, incisional pain, tolerating PO, + flatus, + BM and no problems voiding.    Objective: I have reviewed patient's vital signs and medications.  General: alert, cooperative and no distress GI: soft, non-tender; bowel sounds normal; no masses,  no organomegaly Extremities: extremities normal, atraumatic, no cyanosis or edema   Assessment/Plan: Metritis Doing well will complete 48 hours of IV abx and determine if we need to continue in house treatment  LOS: 2 days    [name] H. 03/18/2012, 8:50 AM Temp:  [97.9 F (36.6 C)-98.4 F (36.9 C)] 98.4 F (36.9 C) (09/19 0522) Pulse Rate:  [63-84] 84  (09/19 0522) Resp:  [16-20] 18  (09/19 0522) BP: (102-124)/(52-79) 118/61 mmHg (09/19 0522) SpO2:  [98 %-100 %] 99 % (09/19 0522) Weight:  [57.72 kg (127 lb 4 oz)] 57.72 kg (127 lb 4 oz) (09/19 0543) CBC    Component Value Date/Time   WBC 10.9* 03/16/2012 1325   RBC 4.05 03/16/2012 1325   HGB 11.1* 03/16/2012 1325   HCT 33.8* 03/16/2012 1325   PLT 374 03/16/2012 1325   MCV 83.5 03/16/2012 1325   MCH 27.4 03/16/2012 1325   MCHC 32.8 03/16/2012 1325   RDW 15.1 03/16/2012 1325   LYMPHSABS 4.6* 03/16/2012 1325   MONOABS 0.9 03/16/2012 1325   EOSABS 0.2 03/16/2012 1325   BASOSABS 0.0 03/16/2012 1325   CMP     Component Value Date/Time   NA 140 03/16/2012 1325   K 4.0 03/16/2012 1325   CL 105 03/16/2012 1325   CO2 25 03/16/2012 1325   GLUCOSE 64* 03/16/2012 1325   BUN 18 03/16/2012 1325   CREATININE 0.50 03/16/2012 1325   CREATININE 0.54 03/04/2012 1030   CALCIUM 9.6 03/16/2012 1325   PROT 7.0 03/16/2012 1325   ALBUMIN 2.9* 03/16/2012 1325   AST 21 03/16/2012 1325   ALT 29 03/16/2012 1325   ALKPHOS 89 03/16/2012 1325   BILITOT 0.2* 03/16/2012 1325   GFRNONAA >90 03/16/2012 1325   GFRAA >90 03/16/2012 1325   Scheduled Meds:   . ampicillin-sulbactam (UNASYN) IV  3 g Intravenous Q6H  . docusate sodium  100 mg Oral BID  . insulin aspart  0-23 Units  Subcutaneous TID AC & HS  . insulin glargine  10 Units Subcutaneous Once  . insulin glargine  25 Units Subcutaneous BID  . prenatal multivitamin  1 tablet Oral Daily   Continuous Infusions:   . sodium chloride 20 mL/hr at 03/17/12 1951   PRN Meds:.alum & mag hydroxide-simeth, ibuprofen, ondansetron (ZOFRAN) IV, ondansetron, oxyCODONE-acetaminophen, zolpidem

## 2012-03-18 NOTE — Progress Notes (Signed)
Hypoglycemic Event  CBG: 22 (1235)  Treatment: 1 tube instant glucose and 15 GM carbohydrate snack  Symptoms: Sweaty, Shaky and Hungry  Follow-up CBG: Time:1255 CBG Result 103  Possible Reasons for Event: medication regimen  Comments/MD notified:Patients physician notified, patient was rechecked 15 mins after treatment with glucose increased to 103. Pt reports feeling better.      Laray Anger  Remember to initiate Hypoglycemia Order Set & complete

## 2012-03-19 ENCOUNTER — Ambulatory Visit (HOSPITAL_COMMUNITY): Payer: Medicaid Other

## 2012-03-19 LAB — CBC WITH DIFFERENTIAL/PLATELET
Basophils Absolute: 0 10*3/uL (ref 0.0–0.1)
Basophils Relative: 0 % (ref 0–1)
Eosinophils Absolute: 0.2 10*3/uL (ref 0.0–0.7)
MCH: 27.2 pg (ref 26.0–34.0)
MCHC: 32.7 g/dL (ref 30.0–36.0)
Neutrophils Relative %: 34 % — ABNORMAL LOW (ref 43–77)
Platelets: 395 10*3/uL (ref 150–400)
RDW: 14.9 % (ref 11.5–15.5)

## 2012-03-19 LAB — BASIC METABOLIC PANEL
BUN: 19 mg/dL (ref 6–23)
Calcium: 8.7 mg/dL (ref 8.4–10.5)
GFR calc Af Amer: >90 mL/min (ref >90)
GFR calc non Af Amer: >90 mL/min (ref >90)
Glucose, Bld: 80 mg/dL (ref 70–99)
Sodium: 141 mEq/L (ref 135–145)

## 2012-03-19 LAB — GLUCOSE, CAPILLARY: Glucose-Capillary: 70 mg/dL (ref 70–99)

## 2012-03-19 MED ORDER — AMOXICILLIN-POT CLAVULANATE 875-125 MG PO TABS: 1.0000 | ORAL_TABLET | Freq: Two times a day (BID) | ORAL | Status: DC

## 2012-03-19 MED ORDER — OXYCODONE-ACETAMINOPHEN 5-325 MG PO TABS: 1.0000 | ORAL_TABLET | ORAL | Status: DC | PRN

## 2012-03-19 NOTE — Progress Notes (Signed)
Patient ID: [name], female   DOB: 1970/08/08, 41 y.o.   MRN: [id number] See DC summary

## 2012-03-19 NOTE — Progress Notes (Signed)
Dr. Christell Constant notified of CGG checks and sliding scale changes done at 2100 03/18/12 and Dr. Christell Constant in agreement to continue with CBG checks TID with meals and HS with sliding scale coverage (Moderate   Non-Pregnant Glycemic Control Order.)

## 2012-03-19 NOTE — Progress Notes (Signed)
Pt   Teaching complete   Informed husband   Of discharged   Instructions  Ambulated out

## 2012-03-19 NOTE — Discharge Summary (Signed)
Physician Discharge Summary  Patient ID: [name] MRN: [id number] DOB/AGE: 12/12/1970 41 y.o.  Admit date: 03/16/2012 Discharge date: 03/19/2012  Admission Diagnoses:postpartum metritis, s/p preterm CSx, fever, DM  Discharge Diagnoses: same plus fever resolved and metritis resolved Active Problems:  * No active hospital problems. *    Discharged Condition: good  Hospital Course: Pt admitted for postpartum metritis and IV antibiotics resolved the problem almost immediately and almost complete resolution of the pain. US revealed no evidence of POC.  CBC    Component Value Date/Time   WBC 7.7 03/19/2012 0515   RBC 3.83* 03/19/2012 0515   HGB 10.4* 03/19/2012 0515   HCT 31.8* 03/19/2012 0515   PLT 395 03/19/2012 0515   MCV 83.0 03/19/2012 0515   MCH 27.2 03/19/2012 0515   MCHC 32.7 03/19/2012 0515   RDW 14.9 03/19/2012 0515   LYMPHSABS 4.5* 03/19/2012 0515   MONOABS 0.4 03/19/2012 0515   EOSABS 0.2 03/19/2012 0515   BASOSABS 0.0 03/19/2012 0515    The blood sugars were difficult to control per the pt home regimen and was switched to sliding scale for here and will resume diet and regimen at home.  Also will continue on Augmentin to complete 7 day course of abx. Consults: None  Significant Diagnostic Studies: radiology: [place] of pelvis  Treatments: antibiotics: Unasyn  Discharge Exam: Blood pressure 143/85, pulse 78, temperature 98.5 F (36.9 C), temperature source Oral, resp. rate 18, height 5\' 2"  (1.575 m), weight 57.607 kg (127 lb), last menstrual period 08/20/2011, SpO2 96.00%, currently breastfeeding. General appearance: alert, cooperative and no distress Back: no CVA tenderness GI: soft, non-tender; bowel sounds normal; no masses,  no organomegaly Extremities: extremities normal, atraumatic, no cyanosis or edema  Disposition: 01-Home or Self Care  Discharge Orders    Future Orders Please Complete By Expires   Diet - low sodium heart healthy      Discharge instructions      Comments:   Call for problems   Lifting restrictions      Comments:   Weight restriction of 20 lbs.   Driving restriction       Comments:   Avoid driving for at least 2 weeks.   Sexual acrtivity      Comments:   None for 2 weeks   No wound care      Call MD for:  extreme fatigue      Call MD for:  persistant dizziness or light-headedness      Call MD for:  hives      Call MD for:  difficulty breathing, headache or visual disturbances      Call MD for:  redness, tenderness, or signs of infection (pain, swelling, redness, odor or green/yellow discharge around incision site)      Call MD for:  severe uncontrolled pain      Call MD for:  persistant nausea and vomiting      Call MD for:  temperature >100.4          Medication List     As of 03/19/2012  5:20 PM    TAKE these medications         amoxicillin-clavulanate 875-125 MG per tablet   Commonly known as: AUGMENTIN   Take 1 tablet by mouth 2 (two) times daily.      insulin aspart 100 UNIT/ML injection   Commonly known as: novoLOG   Inject 9-23 Units into the skin 3 (three) times daily before meals. Sliding scale if sugar is 70-90  inject 9 units  injebreakfast lunch and dinner,if sugar is 91-130,inject 15 units at breakfast, lunch and dinner,if sugar is 131-150, inject 16 units at breakfast ,lunch and dinner,if sugar 151-200,inject 17 units at breakfast,lunch and dinner.If  Sugar is 201-250 , inject 18 units with meals,if sugar 251-300 , inject 19 units with meals,if sugar 301-350, inject 20 units with meals;  If sugar 351-400, inject 21 units with meals,if sugar 401-450 inject 22 units with meals, if sugar> 450, inject 23 units with meals      insulin glargine 100 UNIT/ML injection   Commonly known as: LANTUS   Inject 25 Units into the skin 2 (two) times daily.      oxyCODONE-acetaminophen 5-325 MG per tablet   Commonly known as: PERCOCET/ROXICET   Take 1 tablet by mouth every 4 (four) hours as needed for pain.          Signed: Ch[place] Straus H. 03/19/2012, 5:20 PM

## 2014-05-01 ENCOUNTER — Encounter (HOSPITAL_COMMUNITY): Payer: Self-pay | Admitting: *Deleted

## 2015-06-20 ENCOUNTER — Emergency Department (HOSPITAL_BASED_OUTPATIENT_CLINIC_OR_DEPARTMENT_OTHER)
Admission: EM | Admit: 2015-06-20 | Discharge: 2015-06-20 | Disposition: A | Discharge status: Home / Self Care | Payer: BLUE CROSS/BLUE SHIELD | Attending: Emergency Medicine | Admitting: Emergency Medicine

## 2015-06-20 DIAGNOSIS — E1165 Type 2 diabetes mellitus with hyperglycemia: Secondary | ICD-10-CM | POA: Insufficient documentation

## 2015-06-20 DIAGNOSIS — R739 Hyperglycemia, unspecified: Secondary | ICD-10-CM

## 2015-06-20 DIAGNOSIS — Z792 Long term (current) use of antibiotics: Secondary | ICD-10-CM | POA: Diagnosis not present

## 2015-06-20 DIAGNOSIS — Z794 Long term (current) use of insulin: Secondary | ICD-10-CM | POA: Diagnosis not present

## 2015-06-20 DIAGNOSIS — R079 Chest pain, unspecified: Secondary | ICD-10-CM | POA: Insufficient documentation

## 2015-06-20 LAB — CBC WITH DIFFERENTIAL/PLATELET
BASOS ABS: 0 10*3/uL (ref 0.0–0.1)
Basophils Relative: 0 %
EOS PCT: 1 %
Eosinophils Absolute: 0.1 10*3/uL (ref 0.0–0.7)
HCT: 37.7 % (ref 36.0–46.0)
HEMOGLOBIN: 12.3 g/dL (ref 12.0–15.0)
LYMPHS PCT: 23 %
Lymphs Abs: 2.2 10*3/uL (ref 0.7–4.0)
MCH: 26.4 pg (ref 26.0–34.0)
MCHC: 32.6 g/dL (ref 30.0–36.0)
MCV: 80.9 fL (ref 78.0–100.0)
Monocytes Absolute: 0.5 10*3/uL (ref 0.1–1.0)
Monocytes Relative: 6 %
NEUTROS ABS: 6.6 10*3/uL (ref 1.7–7.7)
Neutrophils Relative %: 70 %
PLATELETS: 298 10*3/uL (ref 150–400)
RBC: 4.66 MIL/uL (ref 3.87–5.11)
RDW: 13 % (ref 11.5–15.5)
WBC: 9.5 10*3/uL (ref 4.0–10.5)

## 2015-06-20 LAB — I-STAT VENOUS BLOOD GAS, ED
ACID-BASE DEFICIT: 7 mmol/L — AB (ref 0.0–2.0)
Bicarbonate: 17.6 mEq/L — ABNORMAL LOW (ref 20.0–24.0)
O2 SAT: 51 %
PCO2 VEN: 33 mmHg — AB (ref 45.0–50.0)
PH VEN: 7.332 — AB (ref 7.250–7.300)
TCO2: 19 mmol/L (ref 0–100)
pO2, Ven: 28 mmHg — CL (ref 30.0–45.0)

## 2015-06-20 LAB — BASIC METABOLIC PANEL
Anion gap: 17 — ABNORMAL HIGH (ref 5–15)
BUN: 19 mg/dL (ref 6–20)
CALCIUM: 9.5 mg/dL (ref 8.9–10.3)
CHLORIDE: 99 mmol/L — AB (ref 101–111)
CO2: 16 mmol/L — ABNORMAL LOW (ref 22–32)
CREATININE: 0.78 mg/dL (ref 0.44–1.00)
Glucose, Bld: 418 mg/dL — ABNORMAL HIGH (ref 65–99)
Potassium: 3.9 mmol/L (ref 3.5–5.1)
SODIUM: 132 mmol/L — AB (ref 135–145)

## 2015-06-20 LAB — CBG MONITORING, ED
Glucose-Capillary: 402 mg/dL — ABNORMAL HIGH (ref 65–99)
Glucose-Capillary: 342 mg/dL — ABNORMAL HIGH (ref 65–99)
Glucose-Capillary: 225 mg/dL — ABNORMAL HIGH (ref 65–99)
Glucose-Capillary: 182 mg/dL — ABNORMAL HIGH (ref 65–99)
Glucose-Capillary: 192 mg/dL — ABNORMAL HIGH (ref 65–99)

## 2015-06-20 LAB — TROPONIN I: Troponin I: <0.03 ng/mL (ref <0.031)

## 2015-06-20 MED ORDER — SODIUM CHLORIDE 0.9 % IV SOLN
INTRAVENOUS | Status: DC
  Administered 2015-06-20: 2.8 [IU]/h via INTRAVENOUS
  Filled 2015-06-20: qty 2.5

## 2015-06-20 MED ORDER — PANTOPRAZOLE SODIUM 40 MG IV SOLR
40.0000 mg | Freq: Once | INTRAVENOUS | Status: AC
  Administered 2015-06-20: 40 mg via INTRAVENOUS
  Filled 2015-06-20: qty 40

## 2015-06-20 MED ORDER — ACETAMINOPHEN 325 MG PO TABS
650.0000 mg | ORAL_TABLET | Freq: Once | ORAL | Status: AC
  Administered 2015-06-20: 650 mg via ORAL
  Filled 2015-06-20: qty 2

## 2015-06-20 MED ORDER — ASPIRIN 81 MG PO CHEW
324.0000 mg | CHEWABLE_TABLET | Freq: Once | ORAL | Status: AC
  Administered 2015-06-20: 324 mg via ORAL
  Filled 2015-06-20: qty 4

## 2015-06-20 MED ORDER — SODIUM CHLORIDE 0.9 % IV BOLUS (SEPSIS)
1000.0000 mL | Freq: Once | INTRAVENOUS | Status: AC
  Administered 2015-06-20: 1000 mL via INTRAVENOUS

## 2015-06-20 MED ORDER — NITROGLYCERIN 0.4 MG SL SUBL
0.4000 mg | SUBLINGUAL_TABLET | SUBLINGUAL | Status: DC | PRN
  Administered 2015-06-20: 0.4 mg via SUBLINGUAL
  Filled 2015-06-20: qty 1

## 2015-06-20 MED ORDER — DEXTROSE-NACL 5-0.45 % IV SOLN
INTRAVENOUS | Status: DC
  Administered 2015-06-20: 07:00:00 via INTRAVENOUS

## 2015-06-20 NOTE — ED Notes (Signed)
Dr Jeraldine LootsLockwood gave verbal order to draw Troponin now.

## 2015-06-20 NOTE — ED Notes (Signed)
Pt back from b/r, steady gait, no changes.

## 2015-06-20 NOTE — Discharge Instructions (Signed)
As discussed, your evaluation today has been largely reassuring.  But, it is important that you monitor your condition carefully, and do not hesitate to return to the ED if you develop new, or concerning changes in your condition. ? ?Otherwise, please follow-up with your physician for appropriate ongoing care. ? ?

## 2015-06-20 NOTE — ED Notes (Signed)
"  did not like the way ntg made her feel, HR up to 125, no improvement, pt shivering, blankets in place, IVF bolus infusing".  [facility] Read DriversMolpus into see pt. Admission explained.

## 2015-06-20 NOTE — ED Notes (Signed)
Pt reports awoke from sleep with mid chest burning. alsostates she took CBG on home glucometer and reading was "HI" so she injected SQ Novolog 20 units

## 2015-06-20 NOTE — ED Provider Notes (Signed)
CSN: [id number]     Arrival date & time 06/20/15  0457 History   First MD Initiated Contact with Patient 06/20/15 [phone]     Chief Complaint  Patient presents with  . Chest Pain     (Consider location/radiation/quality/duration/timing/severity/associated sxs/prior Treatment) HPI  This is a 44 year old female with a history of diabetes on insulin. She is here with chest pain since awakening about 1 AM. She describes the pain as a burning sensation in her precordium radiating to her left shoulder and arm. There are no specific mitigating or exacerbating factors. It is not worse with deep breaths. She does feel short of breath. She denies nausea or vomiting. She rates her pain as an 8 out of 10 at its worst but much less severe now. She has had polyuria and polydipsia. She checked her sugar at home and it read high so she came to the ED. She took 20 units of NovoLog prior to arrival. She is concerned that her insulin pump is malfunctioning. Her sugar here was noted to be 402.    Past Medical History  Diagnosis Date  . Type 2 diabetes mellitus    Past Surgical History  Procedure Laterality Date  . Cesarean section  03/05/2012    Procedure: CESAREAN SECTION;  Surgeon: Fortino Sic, MD;  Location: [facility];  Service: Obstetrics;  Laterality: N/A;   No family history on file. Social History  Substance Use Topics  . Smoking status: Never Smoker   . Smokeless tobacco: Not on file  . Alcohol Use: No   OB History    Gravida Para Term Preterm AB TAB SAB Ectopic Multiple Living   0 0 0 0 0 6     Review of Systems  All other systems reviewed and are negative.   Allergies  Review of patient's allergies indicates no known allergies.  Home Medications   Prior to Admission medications   Medication Sig Start Date End Date Taking? Authorizing Provider  amoxicillin-clavulanate (AUGMENTIN) 875-125 MG per tablet Take 1 tablet by mouth 2 (two) times daily. 03/19/12   Delbert Harness, MD   insulin aspart (NOVOLOG) 100 UNIT/ML injection Inject 9-23 Units into the skin 3 (three) times daily before meals. Sliding scale if sugar is 70-90 inject 9 units  injebreakfast lunch and dinner,if sugar is 91-130,inject 15 units at breakfast, lunch and dinner,if sugar is 131-150, inject 16 units at breakfast ,lunch and dinner,if sugar 151-200,inject 17 units at breakfast,lunch and dinner.If Sugar is 201-250 , inject 18 units with meals,if sugar 251-300 , inject 19 units with meals,if sugar 301-350, inject 20 units with meals; If sugar 351-400, inject 21 units with meals,if sugar 401-450 inject 22 units with meals, if sugar> 450, inject 23 units with meals    Historical Provider, MD  insulin glargine (LANTUS) 100 UNIT/ML injection Inject 25 Units into the skin 2 (two) times daily.    Historical Provider, MD  oxyCODONE-acetaminophen (ROXICET) 5-325 MG per tablet Take 1 tablet by mouth every 4 (four) hours as needed for pain. 03/19/12   Delbert Harness, MD   BP 105/63 mmHg  Pulse 91  Temp(Src) 97.7 F (36.5 C) (Oral)  Resp 18  Ht 5' (1.524 m)  Wt 116 lb (52.617 kg)  BMI 22.65 kg/m2  SpO2 99%   Physical Exam  General: Well-developed, well-nourished female in no acute distress; appearance consistent with age of record HENT: normocephalic; atraumatic Eyes: pupils equal, round and reactive to light; extraocular muscles intact  Neck: supple Heart: regular rate and rhythm; tachycardia Lungs: clear to auscultation bilaterally Abdomen: soft; nondistended; nontender; no masses or hepatosplenomegaly; bowel sounds present Extremities: No deformity; full range of motion; pulses normal Neurologic: Awake, alert and oriented; motor function intact in all extremities and symmetric; no facial droop Skin: Warm and dry Psychiatric: Normal mood and affect    ED Course  Procedures (including critical care time)   EKG Interpretation   Date/Time:  Wednesday June 20 2015 05:11:00 EST Ventricular Rate:   112 PR Interval:  124 QRS Duration: 82 QT Interval:  340 QTC Calculation: 464 R Axis:   56 Text Interpretation:  Sinus tachycardia Nonspecific ST abnormality  Abnormal ECG No previous ECGs available Confirmed by Rendy Lazard  MD, Jonny RuizJOHN  [id number]) on 06/20/2015 5:13:36 AM      MDM   Nursing notes and vitals signs, including pulse oximetry, reviewed.  Summary of this visit's results, reviewed by myself:  Labs:  Results for orders placed or performed during the hospital encounter of 06/20/15 (from the past 24 hour(s))  POC CBG, ED     Status: Abnormal   Collection Time: 06/20/15  5:15 AM  Result Value Ref Range   Glucose-Capillary 402 (H) 65 - 99 mg/dL  CBC with Differential/Platelet     Status: None   Collection Time: 06/20/15  5:32 AM  Result Value Ref Range   WBC 9.5 4.0 - 10.5 K/uL   RBC 4.66 3.87 - 5.11 MIL/uL   Hemoglobin 12.3 12.0 - 15.0 g/dL   HCT 52.837.7 41.336.0 - 24.446.0 %   MCV 80.9 78.0 - 100.0 fL   MCH 26.4 26.0 - 34.0 pg   MCHC 32.6 30.0 - 36.0 g/dL   RDW 01.013.0 27.211.5 - 53.615.5 %   Platelets 298 150 - 400 K/uL   Neutrophils Relative % 70 %   Neutro Abs 6.6 1.7 - 7.7 K/uL   Lymphocytes Relative 23 %   Lymphs Abs 2.2 0.7 - 4.0 K/uL   Monocytes Relative 6 %   Monocytes Absolute 0.5 0.1 - 1.0 K/uL   Eosinophils Relative 1 %   Eosinophils Absolute 0.1 0.0 - 0.7 K/uL   Basophils Relative 0 %   Basophils Absolute 0.0 0.0 - 0.1 K/uL   WBC Morphology WHITE COUNT CONFIRMED ON SMEAR    Smear Review PLATELET COUNT CONFIRMED BY SMEAR   Basic metabolic panel     Status: Abnormal   Collection Time: 06/20/15  5:32 AM  Result Value Ref Range   Sodium 132 (L) 135 - 145 mmol/L   Potassium 3.9 3.5 - 5.1 mmol/L   Chloride 99 (L) 101 - 111 mmol/L   CO2 16 (L) 22 - 32 mmol/L   Glucose, Bld 418 (H) 65 - 99 mg/dL   BUN 19 6 - 20 mg/dL   Creatinine, Ser 6.440.78 0.44 - 1.00 mg/dL   Calcium 9.5 8.9 - 03.410.3 mg/dL   GFR calc non Af Amer >60 >60 mL/min   GFR calc Af Amer >60 >60 mL/min   Anion gap 17  (H) 5 - 15  I-Stat Venous Blood Gas, ED ([facility])     Status: Abnormal   Collection Time: 06/20/15  5:32 AM  Result Value Ref Range   pH, Ven 7.332 (H) 7.250 - 7.300   pCO2, Ven 33.0 (L) 45.0 - 50.0 mmHg   pO2, Ven 28.0 (LL) 30.0 - 45.0 mmHg   Bicarbonate 17.6 (L) 20.0 - 24.0 mEq/L   TCO2 19 0 -  100 mmol/L   O2 Saturation 51.0 %   Acid-base deficit 7.0 (H) 0.0 - 2.0 mmol/L   Patient temperature 97.7 F    Collection site IV START    Drawn by RT    Sample type VENOUS    Comment NOTIFIED PHYSICIAN   Troponin I     Status: None   Collection Time: 06/20/15  5:32 AM  Result Value Ref Range   Troponin I <0.03 <0.031 ng/mL  CBG monitoring, ED     Status: Abnormal   Collection Time: 06/20/15  5:46 AM  Result Value Ref Range   Glucose-Capillary 342 (H) 65 - 99 mg/dL  CBG monitoring, ED     Status: Abnormal   Collection Time: 06/20/15  6:51 AM  Result Value Ref Range   Glucose-Capillary 225 (H) 65 - 99 mg/dL   1:61 AM Equivocal change in chest pain with sublingual nitroglycerin.  6:35 AM Patient states that her burning pain has now resolved.   7:03 AM Second troponin scheduled for 8:32AM. Sugar now < 250, IVF switched to D5 half normal saline. Dr. Jeraldine Loots will follow up and make disposition.   Paula Libra, MD 06/20/15 [phone]

## 2015-06-20 NOTE — ED Notes (Signed)
Dr. Read DriversMolpus in to see pt/family.

## 2015-06-20 NOTE — ED Notes (Signed)
On arrival pt lying on stretcher. Pt states pain is gone in shoulders and chest with only slight pressure in back of neck. Pt and husband requesting to go home. Dr Jeraldine LootsLockwood aware. Talked with pt and pt agrees to get troponin at 0830 this am and go from there to decide her care. Husband at bedside. Insulin at 1.7cc/hr and D5 1/2 NS at 125cc/hr infusing.

## 2015-06-20 NOTE — ED Notes (Signed)
Up to b/r, steady gait, husband with pt.

## 2015-06-20 NOTE — ED Notes (Signed)
Dr. Molpus at [facility] 

## 2021-07-25 ENCOUNTER — Telehealth: Payer: Self-pay | Admitting: Internal Medicine

## 2021-07-25 NOTE — Telephone Encounter (Signed)
Pts daughter is calling to make sure that the referral will be going to Dr. Lonzo Cloud and location in [facility]

## 2021-08-21 NOTE — Progress Notes (Signed)
Name: [name]  MRN/ DOB: [id number], [birth year]   Age/ Sex: 51 y.o., female    PCP: System, Provider Not In   Reason for Endocrinology Evaluation: Type 1 Diabetes Mellitus     Date of Initial Endocrinology Visit: 08/21/2021     PATIENT IDENTIFIER: [name] is a 51 y.o. female with a past medical history of T1DM. The patient presented for initial endocrinology clinic visit on 08/21/2021 for consultative assistance with her diabetes management.    HPI: [name] was    Diagnosed with DM in 2010  Currently checking blood sugars multiple  x / day, through freestyle libre but the ast entry was in 05/2021  Hypoglycemia episodes : yes               Symptoms: yes                 Frequency: daily Hemoglobin A1c has ranged from 8.5% in 2021, peaking at 10.7% in 2017. Patient required assistance for hypoglycemia: no Patient has required hospitalization within the last 1 year from hyper or hypoglycemia: no  In terms of diet, the patient 3 meals a day , snacks at bedtime   Transferred care from [facility], was last seen there in 2021   Has body aches all over  She is on vitamin D daily - unknown dose   hair falling  Has burning of the feet at night, wakes her up     HOME DIABETES REGIMEN: Toujeo  50 units daily in AM  Novolog 20-25 units with each meal      Statin: no ACE-I/ARB: yes  METER DOWNLOAD SUMMARY: Did not bring  DIABETIC COMPLICATIONS: Microvascular complications:  Neuropathy Denies: CKD, retinopathy  Last eye exam: Completed 2021  Macrovascular complications:   Denies: CAD, PVD, CVA   PAST HISTORY: Past Medical History:  Past Medical History:  Diagnosis Date   Type 2 diabetes mellitus (HCC)    Past Surgical History:  Past Surgical History:  Procedure Laterality Date   CESAREAN SECTION  03/05/2012   Procedure: CESAREAN SECTION;  Surgeon: Fortino Sic, MD;  Location: [facility];  Service: Obstetrics;  Laterality: N/A;    Social History:   reports that she has never smoked. She does not have any smokeless tobacco history on file. She reports that she does not drink alcohol and does not use drugs. Family History: No family history on file.   HOME MEDICATIONS: Allergies as of 08/22/2021   No Known Allergies      Medication List        Accurate as of August 21, 2021  5:59 PM. If you have any questions, ask your nurse or doctor.          amoxicillin-clavulanate 875-125 MG tablet Commonly known as: Augmentin Take 1 tablet by mouth 2 (two) times daily.   insulin aspart 100 UNIT/ML injection Commonly known as: novoLOG Inject 9-23 Units into the skin 3 (three) times daily before meals. Sliding scale if sugar is 70-90 inject 9 units  injebreakfast lunch and dinner,if sugar is 91-130,inject 15 units at breakfast, lunch and dinner,if sugar is 131-150, inject 16 units at breakfast ,lunch and dinner,if sugar 151-200,inject 17 units at breakfast,lunch and dinner.If Sugar is 201-250 , inject 18 units with meals,if sugar 251-300 , inject 19 units with meals,if sugar 301-350, inject 20 units with meals; If sugar 351-400, inject 21 units with meals,if sugar 401-450 inject 22 units with meals, if sugar> 450, inject 23 units with meals  insulin glargine 100 UNIT/ML injection Commonly known as: LANTUS Inject 25 Units into the skin 2 (two) times daily.   oxyCODONE-acetaminophen 5-325 MG tablet Commonly known as: Roxicet Take 1 tablet by mouth every 4 (four) hours as needed for pain.         ALLERGIES: No Known Allergies   REVIEW OF SYSTEMS: A comprehensive ROS was conducted with the patient and is negative except as per HPI and below:  ROS    OBJECTIVE:   VITAL SIGNS: BP 122/74 (BP Location: Left Arm, Patient Position: Sitting, Cuff Size: Small)    Pulse 71    Ht 5' (1.524 m)    Wt 128 lb 3.2 oz (58.2 kg)    SpO2 99%    BMI 25.04 kg/m    PHYSICAL EXAM:  General: Pt appears well and is in NAD  Hydration:  Well-hydrated with moist mucous membranes and good skin turgor  HEENT: Head: Unremarkable with good dentition. Oropharynx clear without exudate.  Eyes: External eye exam normal without stare, lid lag or exophthalmos.  EOM intact.  PERRL.  Neck: General: Supple without adenopathy or carotid bruits. Thyroid: Thyroid size normal.  No goiter or nodules appreciated. No thyroid bruit.  Lungs: Clear with good BS bilat with no rales, rhonchi, or wheezes  Heart: RRR with normal S1 and S2 and no gallops; no murmurs; no rub  Abdomen: Normoactive bowel sounds, soft, nontender, without masses or organomegaly palpable  Extremities:  Lower extremities - No pretibial edema. No lesions.  Skin: Normal texture and temperature to palpation. No rash noted. No Acanthosis nigricans/skin tags. No lipohypertrophy.  Neuro: MS is good with appropriate affect, pt is alert and Ox3    DM foot exam: 08/22/2021  The skin of the feet is intact without sores or ulcerations. The pedal pulses are 2+ on right and 2+ on left. The sensation is intact to a screening 5.07, 10 gram monofilament bilaterally   DATA REVIEWED:   Latest Reference Range & Units 08/22/21 09:25  Sodium 135 - 145 mEq/L 135  Potassium 3.5 - 5.1 mEq/L 4.6  Chloride 96 - 112 mEq/L 100  CO2 19 - 32 mEq/L 30  Glucose 70 - 99 mg/dL 573 (H)  BUN 6 - 23 mg/dL 17  Creatinine 2.20 - 2.54 mg/dL 2.70  Calcium 8.4 - 62.3 mg/dL 9.7  GFR >76.28 mL/min 92.53  Total CHOL/HDL Ratio  4  Cholesterol 0 - 200 mg/dL 315  HDL Cholesterol >17.61 mg/dL 60.73 (L)  LDL (calc) 0 - 99 mg/dL 710 (H)  MICROALB/CREAT RATIO 0.0 - 30.0 mg/g 1.7  NonHDL  128.47  Triglycerides 0.0 - 149.0 mg/dL 626.9  VLDL 0.0 - 48.5 mg/dL 46.2    Latest Reference Range & Units 08/22/21 09:25  VITD 30.00 - 100.00 ng/mL 24.73 (L)     Latest Reference Range & Units 08/22/21 09:25  TSH 0.35 - 5.50 uIU/mL 1.03  T4,Free(Direct) 0.60 - 1.60 ng/dL 7.03    Latest Reference Range & Units 08/22/21  09:25  Creatinine,U mg/dL 50.0  Microalb, Ur 0.0 - 1.9 mg/dL <9.3  MICROALB/CREAT RATIO 0.0 - 30.0 mg/g 1.7     ASSESSMENT / PLAN / RECOMMENDATIONS:   1) Type 1 Diabetes Mellitus, Poorly controlled, With neuropathic complications - Most recent A1c of 8.2 %. Goal A1c < 7.0 %.    Plan: GENERAL: I have discussed with the patient the pathophysiology of diabetes. We went over the natural progression of the disease. I explained the complications associated with diabetes including retinopathy,  nephropathy, neuropathy as well as increased risk of cardiovascular disease. We went over the benefit seen with glycemic control.   I explained to the patient that diabetic patients are at higher than normal risk for amputations.  She was unable to understand the concept of carb counting  Due to reported hypoglycemia around 2 AM , no data available for me I am going to reduce Toujeo No changes to her prandial dose of insulin again due to lack of glucose data She will be provided with a correction scale Freestyle Libre 2's sent to the pharmacy  MEDICATIONS: Decrease Toujeo 46 units daily Continue NovoLog 20 units with each meal Correction factor: NovoLog(BG -130/30)  EDUCATION / INSTRUCTIONS: BG monitoring instructions: Patient is instructed to check her blood sugars 3 times a day, before meals . Call [facility] clinic if: BG persistently < 70  I reviewed the Rule of 15 for the treatment of hypoglycemia in detail with the patient. Literature supplied.   2) Diabetic complications:  Eye: Does not have known diabetic retinopathy.  Neuro/ Feet: Does  have known diabetic peripheral neuropathy. Renal: Patient does not have known baseline CKD. She is  on an ACEI/ARB at present.  3) Dyslipidemia:   -LDL above goal at 102 mg/DL.  We will emphasize importance of low-fat diet and will discuss statin therapy on the next visit   4) vitamin D insufficiency:  -Patient will be advised to start  OTC vitamin D3 at 2000 IUs daily  5) Peripheral Neuropathy:  -Patient states she is on small dose of gabapentin and she continues to wake up at night with leg pains -We will increase gabapentin, cautioned against drowsiness    Medication Stop gabapentin 100 mg daily Start gabapentin 300 mg nightly   Follow-up in 3 months  Signed electronically by: Lyndle Herrlich, MD  [facility] Endocrinology  [facility] [address] [place] [place], [place] [address] Phone: [phone] FAX: [phone]   CC: System, Provider Not In No address on file Phone: None  Fax: None    Return to Endocrinology clinic as below: Future Appointments  Date Time Provider Department Center  08/22/2021  8:10 AM Dyllan Kats, Konrad Dolores, MD [id number] None

## 2021-08-22 ENCOUNTER — Other Ambulatory Visit: Payer: Self-pay

## 2021-08-22 ENCOUNTER — Ambulatory Visit: Payer: Self-pay | Admitting: Internal Medicine

## 2021-08-22 VITALS — BP 122/74 | HR 71 | Ht 60.0 in | Wt 128.2 lb

## 2021-08-22 DIAGNOSIS — E1065 Type 1 diabetes mellitus with hyperglycemia: Secondary | ICD-10-CM

## 2021-08-22 DIAGNOSIS — E1042 Type 1 diabetes mellitus with diabetic polyneuropathy: Secondary | ICD-10-CM

## 2021-08-22 DIAGNOSIS — R739 Hyperglycemia, unspecified: Secondary | ICD-10-CM

## 2021-08-22 DIAGNOSIS — E785 Hyperlipidemia, unspecified: Secondary | ICD-10-CM

## 2021-08-22 DIAGNOSIS — E559 Vitamin D deficiency, unspecified: Secondary | ICD-10-CM

## 2021-08-22 LAB — MICROALBUMIN / CREATININE URINE RATIO
Creatinine,U: 41.3 mg/dL
Microalb Creat Ratio: 1.7 mg/g (ref 0.0–30.0)
Microalb, Ur: <0.7 mg/dL (ref 0.0–1.9)

## 2021-08-22 LAB — T4, FREE: Free T4: 0.9 ng/dL (ref 0.60–1.60)

## 2021-08-22 LAB — LIPID PANEL
Cholesterol: 167 mg/dL (ref 0–200)
HDL: 38.5 mg/dL — ABNORMAL LOW (ref >39.00)
LDL Cholesterol: 102 mg/dL — ABNORMAL HIGH (ref 0–99)
NonHDL: 128.47
Total CHOL/HDL Ratio: 4
Triglycerides: 131 mg/dL (ref 0.0–149.0)
VLDL: 26.2 mg/dL (ref 0.0–40.0)

## 2021-08-22 LAB — POCT GLUCOSE (DEVICE FOR HOME USE): Glucose Fasting, POC: 155 mg/dL — AB (ref 70–99)

## 2021-08-22 LAB — BASIC METABOLIC PANEL
BUN: 17 mg/dL (ref 6–23)
CO2: 30 mEq/L (ref 19–32)
Calcium: 9.7 mg/dL (ref 8.4–10.5)
Chloride: 100 mEq/L (ref 96–112)
Creatinine, Ser: 0.75 mg/dL (ref 0.40–1.20)
GFR: 92.53 mL/min (ref >60.00)
Glucose, Bld: 180 mg/dL — ABNORMAL HIGH (ref 70–99)
Potassium: 4.6 mEq/L (ref 3.5–5.1)
Sodium: 135 mEq/L (ref 135–145)

## 2021-08-22 LAB — POCT GLYCOSYLATED HEMOGLOBIN (HGB A1C): Hemoglobin A1C: 8.2 % — AB (ref 4.0–5.6)

## 2021-08-22 LAB — TSH: TSH: 1.03 u[IU]/mL (ref 0.35–5.50)

## 2021-08-22 LAB — VITAMIN D 25 HYDROXY (VIT D DEFICIENCY, FRACTURES): VITD: 24.73 ng/mL — ABNORMAL LOW (ref 30.00–100.00)

## 2021-08-22 MED ORDER — NOVOLOG FLEXPEN 100 UNIT/ML ~~LOC~~ SOPN: PEN_INJECTOR | SUBCUTANEOUS | 3 refills | Status: DC

## 2021-08-22 MED ORDER — FREESTYLE LIBRE 2 READER DEVI: 1.0000 | 0 refills | Status: AC

## 2021-08-22 MED ORDER — TOUJEO SOLOSTAR 300 UNIT/ML ~~LOC~~ SOPN: 46.0000 [IU] | PEN_INJECTOR | Freq: Every day | SUBCUTANEOUS | 3 refills | Status: DC

## 2021-08-22 MED ORDER — FREESTYLE LIBRE 2 SENSOR MISC
1.0000 | 3 refills | Status: DC
Start: 2021-08-22 — End: 2022-06-12

## 2021-08-22 MED ORDER — GABAPENTIN 300 MG PO CAPS: 300.0000 mg | ORAL_CAPSULE | Freq: Every day | ORAL | 3 refills | Status: DC

## 2021-08-22 MED ORDER — INSULIN PEN NEEDLE 32G X 4 MM MISC: 1.0000 | Freq: Four times a day (QID) | 3 refills | Status: AC

## 2021-08-22 NOTE — Patient Instructions (Addendum)
-   Toujeo 46 units daily  - Novolog 20 units with each meal  -Novolog correctional insulin: ADD extra units on insulin to your meal-time Novolog dose if your blood sugars are higher than 160. Use the scale below to help guide you:   Blood sugar before meal Number of units to inject  Less than 160 0 unit  161 -  190 1 units  191 -  220 2 units  221 -  250 3 units  251 -  280 4 units  281 -  310 5 units  311 -  340 6 units    Increase Gabapentin to 300 mg  at bedtime, this may make you drowsy   HOW TO TREAT LOW BLOOD SUGARS (Blood sugar LESS THAN 70 MG/DL) Please follow the RULE OF 15 for the treatment of hypoglycemia treatment (when your (blood sugars are less than 70 mg/dL)   STEP 1: Take 15 grams of carbohydrates when your blood sugar is low, which includes:  3-4 GLUCOSE TABS  OR 3-4 OZ OF JUICE OR REGULAR SODA OR ONE TUBE OF GLUCOSE GEL    STEP 2: RECHECK blood sugar in 15 MINUTES STEP 3: If your blood sugar is still low at the 15 minute recheck --> then, go back to STEP 1 and treat AGAIN with another 15 grams of carbohydrates.

## 2021-08-23 ENCOUNTER — Encounter: Payer: Self-pay | Admitting: Internal Medicine

## 2021-08-23 ENCOUNTER — Telehealth: Payer: Self-pay | Admitting: Internal Medicine

## 2021-08-23 DIAGNOSIS — E785 Hyperlipidemia, unspecified: Secondary | ICD-10-CM | POA: Insufficient documentation

## 2021-08-23 DIAGNOSIS — E559 Vitamin D deficiency, unspecified: Secondary | ICD-10-CM | POA: Insufficient documentation

## 2021-08-23 DIAGNOSIS — E1042 Type 1 diabetes mellitus with diabetic polyneuropathy: Secondary | ICD-10-CM | POA: Insufficient documentation

## 2021-08-23 DIAGNOSIS — E1065 Type 1 diabetes mellitus with hyperglycemia: Secondary | ICD-10-CM | POA: Insufficient documentation

## 2021-08-23 NOTE — Telephone Encounter (Signed)
Vm left for patient to call back.

## 2021-08-23 NOTE — Telephone Encounter (Signed)
Please let the patient know that her vitamin D is low and she needs to take over-the-counter vitamin D to 2000 IUs daily   Please let the patient know that her kidney function is normal  Her cholesterol is elevated and needs to reduce the amount of fried food, and red meat if possible    Thanks   Abby Raelyn Mora, MD  [facility] Endocrinology  [facility] [address] [place], [place] [address] Phone: [phone] FAX: [phone]

## 2021-08-26 MED ORDER — LISINOPRIL 20 MG PO TABS: 20.0000 mg | ORAL_TABLET | Freq: Every day | ORAL | 3 refills | Status: AC

## 2021-08-26 MED ORDER — GABAPENTIN 100 MG PO CAPS: 100.0000 mg | ORAL_CAPSULE | Freq: Every day | ORAL | 3 refills | Status: DC

## 2021-08-26 NOTE — Telephone Encounter (Signed)
Patient notified. Patient states that the Gabapentin is too strong and would like the smaller dose.  Patient would also like to have Lisinopril refilled.

## 2021-08-26 NOTE — Telephone Encounter (Signed)
Patient notified

## 2021-09-30 ENCOUNTER — Emergency Department (HOSPITAL_BASED_OUTPATIENT_CLINIC_OR_DEPARTMENT_OTHER): Payer: 59

## 2021-09-30 ENCOUNTER — Inpatient Hospital Stay (HOSPITAL_BASED_OUTPATIENT_CLINIC_OR_DEPARTMENT_OTHER)
Admission: EM | Admit: 2021-09-30 | Discharge: 2021-10-08 | DRG: 641 | Disposition: A | Discharge status: Home / Self Care | Payer: 59 | Attending: Internal Medicine | Admitting: Internal Medicine

## 2021-09-30 ENCOUNTER — Encounter (HOSPITAL_BASED_OUTPATIENT_CLINIC_OR_DEPARTMENT_OTHER): Payer: Self-pay

## 2021-09-30 ENCOUNTER — Other Ambulatory Visit: Payer: Self-pay

## 2021-09-30 ENCOUNTER — Ambulatory Visit
Admission: RE | Admit: 2021-09-30 | Discharge: 2021-09-30 | Disposition: A | Discharge status: Home / Self Care | Payer: 59 | Source: Ambulatory Visit | Attending: Emergency Medicine | Admitting: Emergency Medicine

## 2021-09-30 VITALS — BP 137/86 | HR 89 | Temp 98.3°F | Resp 18

## 2021-09-30 DIAGNOSIS — K7689 Other specified diseases of liver: Secondary | ICD-10-CM | POA: Diagnosis not present

## 2021-09-30 DIAGNOSIS — R81 Glycosuria: Secondary | ICD-10-CM

## 2021-09-30 DIAGNOSIS — K5900 Constipation, unspecified: Secondary | ICD-10-CM | POA: Diagnosis not present

## 2021-09-30 DIAGNOSIS — R531 Weakness: Secondary | ICD-10-CM | POA: Diagnosis not present

## 2021-09-30 DIAGNOSIS — Z794 Long term (current) use of insulin: Secondary | ICD-10-CM

## 2021-09-30 DIAGNOSIS — Z21 Asymptomatic human immunodeficiency virus [HIV] infection status: Secondary | ICD-10-CM | POA: Diagnosis not present

## 2021-09-30 DIAGNOSIS — R110 Nausea: Secondary | ICD-10-CM | POA: Diagnosis not present

## 2021-09-30 DIAGNOSIS — T452X5A Adverse effect of vitamins, initial encounter: Secondary | ICD-10-CM | POA: Diagnosis present

## 2021-09-30 DIAGNOSIS — Z79899 Other long term (current) drug therapy: Secondary | ICD-10-CM

## 2021-09-30 DIAGNOSIS — E673 Hypervitaminosis D: Secondary | ICD-10-CM | POA: Diagnosis not present

## 2021-09-30 DIAGNOSIS — K769 Liver disease, unspecified: Secondary | ICD-10-CM

## 2021-09-30 DIAGNOSIS — Z20822 Contact with and (suspected) exposure to covid-19: Secondary | ICD-10-CM | POA: Diagnosis present

## 2021-09-30 DIAGNOSIS — D7389 Other diseases of spleen: Secondary | ICD-10-CM | POA: Diagnosis not present

## 2021-09-30 DIAGNOSIS — E1042 Type 1 diabetes mellitus with diabetic polyneuropathy: Secondary | ICD-10-CM | POA: Diagnosis present

## 2021-09-30 DIAGNOSIS — E1065 Type 1 diabetes mellitus with hyperglycemia: Secondary | ICD-10-CM

## 2021-09-30 DIAGNOSIS — R739 Hyperglycemia, unspecified: Secondary | ICD-10-CM

## 2021-09-30 LAB — CBG MONITORING, ED
Glucose-Capillary: 194 mg/dL — ABNORMAL HIGH (ref 70–99)
Glucose-Capillary: 70 mg/dL (ref 70–99)

## 2021-09-30 LAB — POCT URINALYSIS DIP (MANUAL ENTRY)
Bilirubin, UA: NEGATIVE
Blood, UA: NEGATIVE
Glucose, UA: >=1000 mg/dL — AB
Ketones, POC UA: NEGATIVE mg/dL
Leukocytes, UA: NEGATIVE
Nitrite, UA: NEGATIVE
Protein Ur, POC: NEGATIVE mg/dL
Spec Grav, UA: 1.015 (ref 1.010–1.025)
Urobilinogen, UA: 0.2 E.U./dL
pH, UA: 6 (ref 5.0–8.0)

## 2021-09-30 LAB — CBC WITH DIFFERENTIAL/PLATELET
Abs Immature Granulocytes: 0.03 10*3/uL (ref 0.00–0.07)
Basophils Absolute: 0 10*3/uL (ref 0.0–0.1)
Basophils Relative: 0 %
Eosinophils Absolute: 0.2 10*3/uL (ref 0.0–0.5)
Eosinophils Relative: 3 %
HCT: 36.7 % (ref 36.0–46.0)
Hemoglobin: 12.2 g/dL (ref 12.0–15.0)
Immature Granulocytes: 0 %
Lymphocytes Relative: 26 %
Lymphs Abs: 2.1 10*3/uL (ref 0.7–4.0)
MCH: 27.2 pg (ref 26.0–34.0)
MCHC: 33.2 g/dL (ref 30.0–36.0)
MCV: 81.9 fL (ref 80.0–100.0)
Monocytes Absolute: 0.9 10*3/uL (ref 0.1–1.0)
Monocytes Relative: 12 %
Neutro Abs: 4.8 10*3/uL (ref 1.7–7.7)
Neutrophils Relative %: 59 %
Platelets: 233 10*3/uL (ref 150–400)
RBC: 4.48 MIL/uL (ref 3.87–5.11)
RDW: 13.5 % (ref 11.5–15.5)
WBC: 8.1 10*3/uL (ref 4.0–10.5)
nRBC: 0 % (ref 0.0–0.2)

## 2021-09-30 LAB — COMPREHENSIVE METABOLIC PANEL
ALT: 21 U/L (ref 0–44)
AST: 19 U/L (ref 15–41)
Albumin: 3.8 g/dL (ref 3.5–5.0)
Alkaline Phosphatase: 94 U/L (ref 38–126)
Anion gap: 8 (ref 5–15)
BUN: 20 mg/dL (ref 6–20)
CO2: 30 mmol/L (ref 22–32)
Calcium: 13.4 mg/dL (ref 8.9–10.3)
Chloride: 97 mmol/L — ABNORMAL LOW (ref 98–111)
Creatinine, Ser: 0.98 mg/dL (ref 0.44–1.00)
GFR, Estimated: >60 mL/min (ref >60)
Glucose, Bld: 191 mg/dL — ABNORMAL HIGH (ref 70–99)
Potassium: 3.7 mmol/L (ref 3.5–5.1)
Sodium: 135 mmol/L (ref 135–145)
Total Bilirubin: 0.6 mg/dL (ref 0.3–1.2)
Total Protein: 9.1 g/dL — ABNORMAL HIGH (ref 6.5–8.1)

## 2021-09-30 LAB — URINALYSIS, ROUTINE W REFLEX MICROSCOPIC
Bilirubin Urine: NEGATIVE
Glucose, UA: NEGATIVE mg/dL
Hgb urine dipstick: NEGATIVE
Ketones, ur: NEGATIVE mg/dL
Leukocytes,Ua: NEGATIVE
Nitrite: NEGATIVE
Protein, ur: NEGATIVE mg/dL
Specific Gravity, Urine: 1.015 (ref 1.005–1.030)
pH: 7 (ref 5.0–8.0)

## 2021-09-30 LAB — I-STAT VENOUS BLOOD GAS, ED
Acid-Base Excess: 8 mmol/L — ABNORMAL HIGH (ref 0.0–2.0)
Bicarbonate: 32.9 mmol/L — ABNORMAL HIGH (ref 20.0–28.0)
Calcium, Ion: 1.75 mmol/L (ref 1.15–1.40)
HCT: 38 % (ref 36.0–46.0)
Hemoglobin: 12.9 g/dL (ref 12.0–15.0)
O2 Saturation: 77 %
Potassium: 4.2 mmol/L (ref 3.5–5.1)
Sodium: 136 mmol/L (ref 135–145)
TCO2: 34 mmol/L — ABNORMAL HIGH (ref 22–32)
pCO2, Ven: 47.8 mmHg (ref 44–60)
pH, Ven: 7.445 — ABNORMAL HIGH (ref 7.25–7.43)
pO2, Ven: 40 mmHg (ref 32–45)

## 2021-09-30 LAB — POCT FASTING CBG KUC MANUAL ENTRY: POCT Glucose (KUC): 297 mg/dL — AB (ref 70–99)

## 2021-09-30 LAB — LIPASE, BLOOD: Lipase: 26 U/L (ref 11–51)

## 2021-09-30 LAB — RESP PANEL BY RT-PCR (FLU A&B, COVID) ARPGX2
Influenza A by PCR: NEGATIVE
Influenza B by PCR: NEGATIVE
SARS Coronavirus 2 by RT PCR: NEGATIVE

## 2021-09-30 LAB — BETA-HYDROXYBUTYRIC ACID: Beta-Hydroxybutyric Acid: 0.27 mmol/L (ref 0.05–0.27)

## 2021-09-30 LAB — GLUCOSE, CAPILLARY: Glucose-Capillary: 179 mg/dL — ABNORMAL HIGH (ref 70–99)

## 2021-09-30 MED ORDER — ACETAMINOPHEN 650 MG RE SUPP: 650.0000 mg | Freq: Four times a day (QID) | RECTAL | Status: DC | PRN

## 2021-09-30 MED ORDER — GABAPENTIN 100 MG PO CAPS
100.0000 mg | ORAL_CAPSULE | Freq: Every day | ORAL | Status: DC
  Administered 2021-09-30: 100 mg via ORAL
  Filled 2021-09-30 (×6): qty 1

## 2021-09-30 MED ORDER — SODIUM CHLORIDE 0.9 % IV SOLN: INTRAVENOUS | Status: DC

## 2021-09-30 MED ORDER — LACTATED RINGERS IV BOLUS
1000.0000 mL | Freq: Once | INTRAVENOUS | Status: AC
  Administered 2021-09-30: 1000 mL via INTRAVENOUS

## 2021-09-30 MED ORDER — ENOXAPARIN SODIUM 40 MG/0.4ML IJ SOSY
40.0000 mg | PREFILLED_SYRINGE | Freq: Every day | INTRAMUSCULAR | Status: DC
  Administered 2021-09-30 – 2021-10-07 (×7): 40 mg via SUBCUTANEOUS
  Filled 2021-09-30 (×8): qty 0.4

## 2021-09-30 MED ORDER — SODIUM CHLORIDE 0.9 % IV SOLN
Freq: Once | INTRAVENOUS | Status: AC
Start: 2021-09-30 — End: 2021-09-30

## 2021-09-30 MED ORDER — INSULIN GLARGINE-YFGN 100 UNIT/ML ~~LOC~~ SOLN
60.0000 [IU] | Freq: Every day | SUBCUTANEOUS | Status: DC
  Filled 2021-09-30: qty 0.6

## 2021-09-30 MED ORDER — INSULIN GLARGINE-YFGN 100 UNIT/ML ~~LOC~~ SOLN: 60.0000 [IU] | Freq: Every day | SUBCUTANEOUS | Status: DC

## 2021-09-30 MED ORDER — INSULIN GLARGINE (1 UNIT DIAL) 300 UNIT/ML ~~LOC~~ SOPN: 60.0000 [IU] | PEN_INJECTOR | Freq: Every day | SUBCUTANEOUS | Status: DC

## 2021-09-30 MED ORDER — FUROSEMIDE 10 MG/ML IJ SOLN
20.0000 mg | Freq: Once | INTRAMUSCULAR | Status: AC
  Administered 2021-09-30: 20 mg via INTRAVENOUS
  Filled 2021-09-30: qty 2

## 2021-09-30 MED ORDER — INSULIN ASPART 100 UNIT/ML IJ SOLN
0.0000 [IU] | Freq: Three times a day (TID) | INTRAMUSCULAR | Status: DC
  Administered 2021-10-01: 3 [IU] via SUBCUTANEOUS
  Administered 2021-10-01: 7 [IU] via SUBCUTANEOUS
  Administered 2021-10-01: 4 [IU] via SUBCUTANEOUS
  Administered 2021-10-02 – 2021-10-03 (×4): 11 [IU] via SUBCUTANEOUS
  Administered 2021-10-03 (×2): 7 [IU] via SUBCUTANEOUS
  Administered 2021-10-04: 11 [IU] via SUBCUTANEOUS
  Administered 2021-10-04: 15 [IU] via SUBCUTANEOUS
  Administered 2021-10-05: 7 [IU] via SUBCUTANEOUS
  Administered 2021-10-05: 3 [IU] via SUBCUTANEOUS
  Administered 2021-10-06: 11 [IU] via SUBCUTANEOUS
  Administered 2021-10-06: 15 [IU] via SUBCUTANEOUS
  Administered 2021-10-07 (×2): 3 [IU] via SUBCUTANEOUS

## 2021-09-30 MED ORDER — INSULIN ASPART 100 UNIT/ML IJ SOLN
6.0000 [IU] | Freq: Three times a day (TID) | INTRAMUSCULAR | Status: DC
  Administered 2021-10-01 – 2021-10-03 (×5): 6 [IU] via SUBCUTANEOUS

## 2021-09-30 MED ORDER — INSULIN GLARGINE-YFGN 100 UNIT/ML ~~LOC~~ SOLN: 45.0000 [IU] | Freq: Every day | SUBCUTANEOUS | Status: DC

## 2021-09-30 MED ORDER — INSULIN GLARGINE-YFGN 100 UNIT/ML ~~LOC~~ SOLN
50.0000 [IU] | Freq: Every day | SUBCUTANEOUS | Status: DC
  Administered 2021-10-01 – 2021-10-02 (×2): 50 [IU] via SUBCUTANEOUS
  Filled 2021-09-30 (×3): qty 0.5

## 2021-09-30 MED ORDER — ONDANSETRON HCL 4 MG PO TABS
4.0000 mg | ORAL_TABLET | Freq: Four times a day (QID) | ORAL | Status: DC | PRN
  Administered 2021-10-07: 4 mg via ORAL
  Filled 2021-09-30: qty 1

## 2021-09-30 MED ORDER — ACETAMINOPHEN 325 MG PO TABS
650.0000 mg | ORAL_TABLET | Freq: Four times a day (QID) | ORAL | Status: DC | PRN
Start: 2021-09-30 — End: 2021-10-08
  Administered 2021-09-30 – 2021-10-06 (×8): 650 mg via ORAL
  Filled 2021-09-30 (×8): qty 2

## 2021-09-30 MED ORDER — ONDANSETRON HCL 4 MG/2ML IJ SOLN
4.0000 mg | Freq: Four times a day (QID) | INTRAMUSCULAR | Status: DC | PRN
  Administered 2021-10-02 – 2021-10-07 (×4): 4 mg via INTRAVENOUS
  Filled 2021-09-30 (×5): qty 2

## 2021-09-30 NOTE — H&P (Addendum)
?History and Physical  ? ? ?PatientRobi Wade ZYS:[id number] DOB: 1971/01/31 ?DOA: 09/30/2021 ?DOS: the patient was seen and examined on 09/30/2021 ?PCP: [name] Course, PA-C  ?Patient coming from: Home ? ?Chief Complaint:  ?Chief Complaint  ?Patient presents with  ? Dizziness  ? ?HPI: [name] is a 51 y.o. female with medical history significant of IDDM follows with [facility]. ? ?Pt recently started on OTC vit D at home by endocrinologist in Feb.  Sounds like she got started on once a week Vit D from her description. ? ?Unfortunately turns out pt was also on once daily vit D.  Says no one told her to stop that, so for the past month she has been taking both the once weekly (probably 50k u) and once daily Vit D doses she states. ? ?In to ED at [facility] with fatigue, dizziness, generalized weakness, nausea.  Onset Wed.  Found to have hypercalemia.  Got IVF + Lasix in ED. ?  ?Review of Systems: As mentioned in the history of present illness. All other systems reviewed and are negative. ?Past Medical History:  ?Diagnosis Date  ? Type 2 diabetes mellitus (HCC)   ? ?Past Surgical History:  ?Procedure Laterality Date  ? CESAREAN SECTION  03/05/2012  ? Procedure: CESAREAN SECTION;  Surgeon: [name] Sic, MD;  Location: [facility];  Service: Obstetrics;  Laterality: N/A;  ? ?Social History:  reports that she has never smoked. She does not have any smokeless tobacco history on file. She reports that she does not drink alcohol and does not use drugs. ? ?No Known Allergies ? ?History reviewed. No pertinent family history. ? ?Prior to Admission medications   ?Medication Sig Start Date End Date Taking? Authorizing Provider  ?Continuous Blood Gluc Receiver ([name] 2 READER) DEVI 1 Device by Does not apply route as directed. 08/22/21  Yes Shamleffer, Konrad Dolores, MD  ?Continuous Blood Gluc Sensor ([name] 2 SENSOR) MISC 1 Device by Does not apply route every 14 (fourteen) days. 08/22/21  Yes Shamleffer,  Konrad Dolores, MD  ?Ferrous Sulfate (IRON PO) Take 1 tablet by mouth daily.   Yes [provider]  ?gabapentin (NEURONTIN) 100 MG capsule Take 1 capsule (100 mg total) by mouth at bedtime. 08/26/21  Yes Shamleffer, Konrad Dolores, MD  ?glucose blood (CONTOUR NEXT TEST) test strip 4 (four) times daily. 06/05/20  Yes [provider]  ?insulin aspart (NOVOLOG FLEXPEN) 100 UNIT/ML FlexPen Max daily 100 units ?Patient taking differently: Max daily 100 units; sliding scale 08/22/21  Yes Shamleffer, Konrad Dolores, MD  ?Insulin Pen Needle 32G X 4 MM MISC 1 Device by Does not apply route in the morning, at noon, in the evening, and at bedtime. 08/22/21  Yes Shamleffer, Konrad Dolores, MD  ?lisinopril (ZESTRIL) 20 MG tablet Take 1 tablet (20 mg total) by mouth daily. 08/26/21  Yes Shamleffer, Konrad Dolores, MD  ?[name] SOLOSTAR 300 UNIT/ML Solostar Pen Inject 46 Units into the skin daily. ?Patient taking differently: Inject 60 Units into the skin daily. 08/22/21  Yes Shamleffer, Konrad Dolores, MD  ?VITAMIN D PO Take 1 tablet by mouth daily.   Yes [provider]  ? ? ?Physical Exam: ?Vitals:  ? 09/30/21 1601 09/30/21 1649 09/30/21 1815 09/30/21 2052  ?BP: (!) 162/82 (!) 152/80 (!) 151/93   ?Pulse: 77 74 96   ?Resp: 17 17 16    ?Temp:      ?TempSrc:      ?SpO2: 100% 100% 99%   ?Weight:  58.9 kg  ?Height:    5' (1.524 m)  ? ?Constitutional: NAD, calm, comfortable ?Eyes: PERRL, lids and conjunctivae normal ?ENMT: Mucous membranes are moist. Posterior pharynx clear of any exudate or lesions.Normal dentition.  ?Neck: normal, supple, no masses, no thyromegaly ?Respiratory: clear to auscultation bilaterally, no wheezing, no crackles. Normal respiratory effort. No accessory muscle use.  ?Cardiovascular: Regular rate and rhythm, no murmurs / rubs / gallops. No extremity edema. 2+ pedal pulses. No carotid bruits.  ?Abdomen: no tenderness, no masses palpated. No hepatosplenomegaly. Bowel sounds positive.   ?Musculoskeletal: no clubbing / cyanosis. No joint deformity upper and lower extremities. Good ROM, no contractures. Normal muscle tone.  ?Skin: no rashes, lesions, ulcers. No induration ?Neurologic: CN 2-12 grossly intact. Sensation intact, DTR normal. Strength 5/5 in all 4.  ?Psychiatric: Normal judgment and insight. Alert and oriented x 3. Normal mood.  ? ?Data Reviewed: ? ?Vit D and PTH pending ? ?Assessment and Plan: ?* Hypercalcemia ?Vit D toxicity is top of differential.  On both Daily and weekly Vit D for past month, see HPI. ?Checking Vit D level ?Check PTH ?NS ?Got 1 dose of lasix in ED ?Repeat CMP in AM ?Holding off on calcitonin or bisphosphonate for the moment (high suspicion that we Wade just be dealing with vit D OD). ?Did let pt and family know that this was my suspicion and usually someone should be taking JUST daily OR weekly vit D (not both). ? ?Type 1 diabetes mellitus with diabetic polyneuropathy (HCC) ?Takes 50u daily of glargine in AM + 20-25 mealtime novolog TID at home ?Putting on 50u daily of glargine ?6u mealtime novolog ?And resistant scale SSI TID here ? ? ? ? ? Advance Care Planning:   Code Status: Full Code ? ?Consults: None ? ?Family Communication: Husband at bedside ? ?Severity of Illness: ?The appropriate patient status for this patient is OBSERVATION. Observation status is judged to be reasonable and necessary in order to provide the required intensity of service to ensure the patient's safety. The patient's presenting symptoms, physical exam findings, and initial radiographic and laboratory data in the context of their medical condition is felt to place them at decreased risk for further clinical deterioration. Furthermore, it is anticipated that the patient will be medically stable for discharge from the hospital within 2 midnights of admission.  ? ?Author: ?[name]., DO ?09/30/2021 9:15 PM ? ?For on call review [email or web address].  ?

## 2021-09-30 NOTE — ED Notes (Signed)
[facility] came for the Pt. And she did not want to go with an all man truck.  Pt. Is in no distress and SR on the monitor. ?

## 2021-09-30 NOTE — ED Notes (Signed)
Pt educated on the importance of monitoring blood glucose levels and keeping a diary of results. Pt educated to call her endocrine provider to discuss further recommendations per provider.  ?

## 2021-09-30 NOTE — ED Triage Notes (Signed)
Pt c/o dizziness, fatigue, nausea, lack of appetite that began last Wednesday. ? ?

## 2021-09-30 NOTE — Assessment & Plan Note (Addendum)
Takes 50u daily of glargine in AM + 20-25 mealtime novolog TID at home ?1. Currently on 55u daily of glargine ?2. 10u mealtime novolog ?3. Cont with SSI TID as needed while in hospital ?4. Today, glycemic trends stable ? ?

## 2021-09-30 NOTE — ED Notes (Signed)
Spoke with Pt. Earlier about the team with [facility] coming to get her [facility] and she is ok with this. ? ?Just spoke with the pt. Husband about the Pt. And explained to both that the Pt. Will be getting more blood work at the other hospital and will get results at [facility] long. ?

## 2021-09-30 NOTE — ED Notes (Signed)
Pt. Has been feeling poorly per her son and the Pt. Since last Wednesday.  Pt. Went to UC today and was told to come to the ED for care.  Pt. Recently switched her insurance and has no PCP and needs a referral to a PCP per her speaking with the Nurse.   ? ?No distress noted in the Pt.   Pt. Reports not eating as well as she should. ?

## 2021-09-30 NOTE — ED Notes (Signed)
Patient off floor for scan 

## 2021-09-30 NOTE — ED Provider Notes (Signed)
?[facility] ? ? ? ?CSN: [id number] ?Arrival date & time: 09/30/21  9390 ?  ? ?HISTORY  ? ?Chief Complaint  ?Patient presents with  ? Nausea  ?  Nausea, vomiting, lack of energy, no appetite since Friday. T1 diabetic on insulin. - Entered by patient  ? Dizziness  ? Fatigue  ? ?HPI ?[name] is a 51 y.o. female. Patient complains of dizziness, fatigue, nausea, constipation and a lack of appetite that began 5 days ago.  Urinalysis today demonstrated significant mount of glucose in her urine and fasting fingerstick blood sugar level on arrival was 297.  She has otherwise normal vital signs today. ? ?The history is provided by the patient.  ?Past Medical History:  ?Diagnosis Date  ? Type 2 diabetes mellitus (HCC)   ? ?Patient Active Problem List  ? Diagnosis Date Noted  ? Dyslipidemia 08/23/2021  ? Type 1 diabetes mellitus with hyperglycemia (HCC) 08/23/2021  ? Type 1 diabetes mellitus with diabetic polyneuropathy (HCC) 08/23/2021  ? Vitamin D insufficiency 08/23/2021  ? ?Past Surgical History:  ?Procedure Laterality Date  ? CESAREAN SECTION  03/05/2012  ? Procedure: CESAREAN SECTION;  Surgeon: Fortino Sic, MD;  Location: [facility];  Service: Obstetrics;  Laterality: N/A;  ? ?OB History   ? ? Gravida  ?6  ? Para  ?6  ? Term  ?4  ? Preterm  ?2  ? AB  ?0  ? Living  ?6  ?  ? ? SAB  ?0  ? IAB  ?0  ? Ectopic  ?0  ? Multiple  ?0  ? Live Births  ?1  ?   ?  ?  ? ?Home Medications   ? ?Prior to Admission medications   ?Medication Sig Start Date End Date Taking? Authorizing Provider  ?Continuous Blood Gluc Receiver (FREESTYLE LIBRE 2 READER) DEVI 1 Device by Does not apply route as directed. 08/22/21   Shamleffer, Konrad Dolores, MD  ?Continuous Blood Gluc Sensor (FREESTYLE LIBRE 2 SENSOR) MISC 1 Device by Does not apply route every 14 (fourteen) days. 08/22/21   Shamleffer, Konrad Dolores, MD  ?gabapentin (NEURONTIN) 100 MG capsule Take 1 capsule (100 mg total) by mouth at bedtime. 08/26/21   Shamleffer, Konrad Dolores, MD  ?glucose blood (CONTOUR NEXT TEST) test strip 4 (four) times daily. 06/05/20   [provider]  ?insulin aspart (NOVOLOG FLEXPEN) 100 UNIT/ML FlexPen Max daily 100 units 08/22/21   Shamleffer, Konrad Dolores, MD  ?Insulin Pen Needle 32G X 4 MM MISC 1 Device by Does not apply route in the morning, at noon, in the evening, and at bedtime. 08/22/21   Shamleffer, Konrad Dolores, MD  ?Harland German 0.15-30 MG-MCG tablet Take 1 tablet by mouth daily. 03/14/21   [provider]  ?lisinopril (ZESTRIL) 20 MG tablet Take 1 tablet (20 mg total) by mouth daily. 08/26/21   Shamleffer, Konrad Dolores, MD  ?oxyCODONE-acetaminophen (ROXICET) 5-325 MG per tablet Take 1 tablet by mouth every 4 (four) hours as needed for pain. 03/19/12   Delbert Harness, MD  ?Nathen May SOLOSTAR 300 UNIT/ML Solostar Pen Inject 46 Units into the skin daily. 08/22/21   Shamleffer, Konrad Dolores, MD  ? ? ?Family History ?History reviewed. No pertinent family history. ?Social History ?Social History  ? ?Tobacco Use  ? Smoking status: Never  ?Substance Use Topics  ? Alcohol use: No  ? Drug use: No  ? ?Allergies   ?Patient has no known allergies. ? ?Review of Systems ?Review of Systems ?Pertinent findings  noted in history of present illness.  ? ?Physical Exam ?Triage Vital Signs ?ED Triage Vitals  ?Enc Vitals Group  ?   BP 04/26/21 0827 (!) 147/82  ?   Pulse Rate 04/26/21 0827 72  ?   Resp 04/26/21 0827 18  ?   Temp 04/26/21 0827 98.3 ?F (36.8 ?C)  ?   Temp Source 04/26/21 0827 Oral  ?   SpO2 04/26/21 0827 98 %  ?   Weight --   ?   Height --   ?   Head Circumference --   ?   Peak Flow --   ?   Pain Score 04/26/21 0826 5  ?   Pain Loc --   ?   Pain Edu? --   ?   Excl. in GC? --   ?No data found. ? ?Updated Vital Signs ?BP 137/86 (BP Location: Right Arm)   Pulse 89   Temp 98.3 ?F (36.8 ?C) (Oral)   Resp 18   SpO2 96%  ? ?Physical Exam ?Vitals and nursing note reviewed.  ?Constitutional:   ?   General: She is not in acute distress. ?    Appearance: Normal appearance. She is not ill-appearing.  ?HENT:  ?   Head: Normocephalic and atraumatic.  ?Eyes:  ?   General: Lids are normal.     ?   Right eye: No discharge.     ?   Left eye: No discharge.  ?   Extraocular Movements: Extraocular movements intact.  ?   Conjunctiva/sclera: Conjunctivae normal.  ?   Right eye: Right conjunctiva is not injected.  ?   Left eye: Left conjunctiva is not injected.  ?Neck:  ?   Trachea: Trachea and phonation normal.  ?Cardiovascular:  ?   Rate and Rhythm: Normal rate and regular rhythm.  ?   Pulses: Normal pulses.  ?   Heart sounds: Normal heart sounds. No murmur heard. ?  No friction rub. No gallop.  ?Pulmonary:  ?   Effort: Pulmonary effort is normal. No accessory muscle usage, prolonged expiration or respiratory distress.  ?   Breath sounds: Normal breath sounds. No stridor, decreased air movement or transmitted upper airway sounds. No decreased breath sounds, wheezing, rhonchi or rales.  ?Chest:  ?   Chest wall: No tenderness.  ?Musculoskeletal:     ?   General: Normal range of motion.  ?   Cervical back: Normal range of motion and neck supple. Normal range of motion.  ?Lymphadenopathy:  ?   Cervical: No cervical adenopathy.  ?Skin: ?   General: Skin is warm and dry.  ?   Findings: No erythema or rash.  ?Neurological:  ?   General: No focal deficit present.  ?   Mental Status: She is alert and oriented to person, place, and time.  ?Psychiatric:     ?   Mood and Affect: Mood normal.     ?   Behavior: Behavior normal.  ? ? ?Visual Acuity ?Right Eye Distance:   ?Left Eye Distance:   ?Bilateral Distance:   ? ?Right Eye Near:   ?Left Eye Near:    ?Bilateral Near:    ? ?UC Couse / Diagnostics / Procedures:  ?  ?EKG ? ?Radiology ?No results found. ? ?Procedures ?Procedures (including critical care time) ? ?UC Diagnoses / Final Clinical Impressions(s)   ?I have reviewed the triage vital signs and the nursing notes. ? ?Pertinent labs & imaging results that were available  during my care of  the patient were reviewed by me and considered in my medical decision making (see chart for details).   ? ?Final diagnoses:  ?Hyperglycemia  ?Elevated serum glucose with glucosuria  ?Type 1 diabetes mellitus with hyperglycemia (HCC)  ? ?Patient advised to follow-up with her diabetes provider today and to go to the emergency room if she is unable to be seen within a reasonable amount of time or if her symptoms worsen before she is seen. ? ?ED Prescriptions   ?None ?  ? ?PDMP not reviewed this encounter. ? ?Pending results:  ?Labs Reviewed  ?POCT URINALYSIS DIP (MANUAL ENTRY) - Abnormal; Notable for the following components:  ?    Result Value  ? Glucose, UA >=1,000 (*)   ? All other components within normal limits  ?POCT FASTING CBG [facility] MANUAL ENTRY - Abnormal; Notable for the following components:  ? POCT Glucose ([facility]) 297 (*)   ? All other components within normal limits  ? ? ?Medications Ordered in UC: ?Medications - No data to display ? ?Disposition Upon Discharge:  ?Condition: stable for discharge home ?Home: take medications as prescribed; routine discharge instructions as discussed; follow up as advised. ? ?Patient presented with an acute illness with associated systemic symptoms and significant discomfort requiring urgent management. In my opinion, this is a condition that a prudent lay person (someone who possesses an average knowledge of health and medicine) may potentially expect to result in complications if not addressed urgently such as respiratory distress, impairment of bodily function or dysfunction of bodily organs.  ? ?Routine symptom specific, illness specific and/or disease specific instructions were discussed with the patient and/or caregiver at length.  ? ?As such, the patient has been evaluated and assessed, work-up was performed and treatment was provided in alignment with urgent care protocols and evidence based medicine.  Patient/parent/caregiver has been advised that the  patient may require follow up for further testing and treatment if the symptoms continue in spite of treatment, as clinically indicated and appropriate. ? ?If the patient was tested for COVID-19, Influenza and/o

## 2021-09-30 NOTE — ED Triage Notes (Addendum)
Pt sent over by UC. C/o dizziness, nausea, fatigue, vomiting since Wednesday. Taking insulin at home for diabetes, states BG was 500 at home, took insulin this morning. Bilateral leg pain ? ?Glucosuria >1,000 at urgent care ?

## 2021-09-30 NOTE — ED Provider Notes (Signed)
?[facility] EMERGENCY DEPARTMENT ?Provider Note ? ? ?CSN: [id number] ?Arrival date & time: 09/30/21  1224 ? ?  ? ?History ? ?Chief Complaint  ?Patient presents with  ? Dizziness  ? ? ?[name] is a 51 y.o. female. ? ?HPI ? ?  ? ?51yo female with history of DM, presents with concern for fatigue, generalized weakness, nausea, lightheadedness. ? ?Began feeling ill on Wednesday.  ?Has had 3 episodes of emesis, clear liquid.  Has low appetite. ?Glucose this AM was greater than 500, took insulin and was 297 at urgent care.  Feels lightheaded when going to stand up.  Mild dyspnea.  No chest pain. Bilateral leg pain. No flank pain. Has had headaches over the last week.  No cough, dysuria.  Has had some constipation.   ? ?Past Medical History:  ?Diagnosis Date  ? Type 2 diabetes mellitus (HCC)   ?  ? ?Home Medications ?Prior to Admission medications   ?Medication Sig Start Date End Date Taking? Authorizing Provider  ?Continuous Blood Gluc Receiver (FREESTYLE LIBRE 2 READER) DEVI 1 Device by Does not apply route as directed. 08/22/21  Yes Shamleffer, Konrad Dolores, MD  ?Continuous Blood Gluc Sensor (FREESTYLE LIBRE 2 SENSOR) MISC 1 Device by Does not apply route every 14 (fourteen) days. 08/22/21  Yes Shamleffer, Konrad Dolores, MD  ?Ferrous Sulfate (IRON PO) Take 1 tablet by mouth daily.   Yes [provider]  ?gabapentin (NEURONTIN) 100 MG capsule Take 1 capsule (100 mg total) by mouth at bedtime. 08/26/21  Yes Shamleffer, Konrad Dolores, MD  ?glucose blood (CONTOUR NEXT TEST) test strip 4 (four) times daily. 06/05/20  Yes [provider]  ?insulin aspart (NOVOLOG FLEXPEN) 100 UNIT/ML FlexPen Max daily 100 units ?Patient taking differently: Max daily 100 units; sliding scale 08/22/21  Yes Shamleffer, Konrad Dolores, MD  ?Insulin Pen Needle 32G X 4 MM MISC 1 Device by Does not apply route in the morning, at noon, in the evening, and at bedtime. 08/22/21  Yes Shamleffer, Konrad Dolores, MD   ?lisinopril (ZESTRIL) 20 MG tablet Take 1 tablet (20 mg total) by mouth daily. 08/26/21  Yes Shamleffer, Konrad Dolores, MD  ?Nathen May SOLOSTAR 300 UNIT/ML Solostar Pen Inject 46 Units into the skin daily. ?Patient taking differently: Inject 60 Units into the skin daily. 08/22/21  Yes Shamleffer, Konrad Dolores, MD  ?VITAMIN D PO Take 1 tablet by mouth daily.   Yes [provider]  ?   ? ?Allergies    ?Patient has no known allergies.   ? ?Review of Systems   ?Review of Systems ?See above ?Physical Exam ?Updated Vital Signs ?BP (!) 152/80   Pulse 74   Temp 98.2 ?F (36.8 ?C)   Resp 17   Ht 5' (1.524 m)   Wt 56.7 kg   SpO2 100%   BMI 24.41 kg/m?  ?Physical Exam ?Vitals and nursing note reviewed.  ?Constitutional:   ?   General: She is not in acute distress. ?   Appearance: She is well-developed. She is not diaphoretic.  ?HENT:  ?   Head: Normocephalic and atraumatic.  ?Eyes:  ?   Conjunctiva/sclera: Conjunctivae normal.  ?Cardiovascular:  ?   Rate and Rhythm: Normal rate and regular rhythm.  ?   Heart sounds: Normal heart sounds. No murmur heard. ?  No friction rub. No gallop.  ?Pulmonary:  ?   Effort: Pulmonary effort is normal. No respiratory distress.  ?   Breath sounds: Normal breath sounds. No wheezing or rales.  ?  Abdominal:  ?   General: There is no distension.  ?   Palpations: Abdomen is soft.  ?   Tenderness: There is no abdominal tenderness. There is no guarding.  ?Musculoskeletal:     ?   General: No tenderness.  ?   Cervical back: Normal range of motion.  ?Skin: ?   General: Skin is warm and dry.  ?   Findings: No erythema or rash.  ?Neurological:  ?   Mental Status: She is alert and oriented to person, place, and time.  ? ? ?ED Results / Procedures / Treatments   ?Labs ?(all labs ordered are listed, but only abnormal results are displayed) ?Labs Reviewed  ?COMPREHENSIVE METABOLIC PANEL - Abnormal; Notable for the following components:  ?    Result Value  ? Chloride 97 (*)   ? Glucose, Bld 191  (*)   ? Calcium 13.4 (*)   ? Total Protein 9.1 (*)   ? All other components within normal limits  ?CBG MONITORING, ED - Abnormal; Notable for the following components:  ? Glucose-Capillary 194 (*)   ? All other components within normal limits  ?I-STAT VENOUS BLOOD GAS, ED - Abnormal; Notable for the following components:  ? pH, Ven 7.445 (*)   ? Bicarbonate 32.9 (*)   ? TCO2 34 (*)   ? Acid-Base Excess 8.0 (*)   ? Calcium, Ion 1.75 (*)   ? All other components within normal limits  ?RESP PANEL BY RT-PCR (FLU A&B, COVID) ARPGX2  ?URINALYSIS, ROUTINE W REFLEX MICROSCOPIC  ?CBC WITH DIFFERENTIAL/PLATELET  ?LIPASE, BLOOD  ?BETA-HYDROXYBUTYRIC ACID  ?PTH, INTACT AND CALCIUM  ?VITAMIN D 25 HYDROXY (VIT D DEFICIENCY, FRACTURES)  ?CBG MONITORING, ED  ?CBG MONITORING, ED  ? ? ?EKG ?EKG Interpretation ? ?Date/Time:  Monday September 30 2021 12:47:21 EDT ?Ventricular Rate:  85 ?PR Interval:  126 ?QRS Duration: 78 ?QT Interval:  340 ?QTC Calculation: 404 ?R Axis:   62 ?Text Interpretation: Normal sinus rhythm Normal ECG When compared with ECG of 20-Jun-2015 05:11, No significant change since last tracing Confirmed by Alvira Monday ([id number]) on 09/30/2021 2:44:32 PM ? ?Radiology ?DG Chest 2 View ? ?Result Date: 09/30/2021 ?CLINICAL DATA:  Nausea, fatigue EXAM: CHEST - 2 VIEW COMPARISON:  None. FINDINGS: Normal mediastinum and cardiac silhouette. Normal pulmonary vasculature. No evidence of effusion, infiltrate, or pneumothorax. No acute bony abnormality. IMPRESSION: No acute cardiopulmonary process. Electronically Signed   By: Genevive Bi M.D.   On: 09/30/2021 15:31  ? ?CT Head Wo Contrast ? ?Result Date: 09/30/2021 ?CLINICAL DATA:  Headache.  Fatigue. EXAM: CT HEAD WITHOUT CONTRAST TECHNIQUE: Contiguous axial images were obtained from the base of the skull through the vertex without intravenous contrast. RADIATION DOSE REDUCTION: This exam was performed according to the departmental dose-optimization program which includes  automated exposure control, adjustment of the mA and/or kV according to patient size and/or use of iterative reconstruction technique. COMPARISON:  None. FINDINGS: Brain: No mass lesion, hemorrhage, hydrocephalus, acute infarct, intra-axial, or extra-axial fluid collection. Basal ganglia calcifications are likely physiologic. Vascular: No hyperdense vessel or unexpected calcification. Skull: Normal Sinuses/Orbits: Normal imaged portions of the orbits and globes. Clear paranasal sinuses and mastoid air cells. Other: None. IMPRESSION: No acute intracranial abnormality. Basal ganglia calcifications, most likely physiologic in this 51 year old patient. Electronically Signed   By: Jeronimo Greaves M.D.   On: 09/30/2021 15:28   ? ?Procedures ?Procedures  ? ? ?Medications Ordered in ED ?Medications  ?lactated ringers bolus 1,000 mL (0 mLs Intravenous  Stopped 09/30/21 1449)  ?0.9 %  sodium chloride infusion ( Intravenous New Bag/Given 09/30/21 1644)  ?furosemide (LASIX) injection 20 mg (20 mg Intravenous Given 09/30/21 1644)  ? ? ?ED Course/ Medical Decision Making/ A&P ?  ?                        ?Medical Decision Making ?Amount and/or Complexity of Data Reviewed ?Labs: ordered. ?Radiology: ordered. ? ?Risk ?Prescription drug management. ?Decision regarding hospitalization. ? ? ? ?51yo female with history of DM, presents with concern for fatigue, generalized weakness, nausea, lightheadedness. ? ?Differential diagnosis includes anemia, electrolyte abnormality, cardiac abnormality, infection, hypothyroidism, other toxic/metabolic abnormalities.   ? ?Given headaches, CT head completed and shows no acute abnormalities. No focal neurologic symptoms to suggest CVA.   Urinalysis shows no sign of infection. Chest x-ray shows no sign of pneumonia or other abnormalities. CBC showed no sign of anemia or leukocytosis.  Initially ordered beta hydroxybutyric acid given history of severe hyperglycemia at home, glucosuria at urgent care--labs show  no sign of DKA here and glucose improved.  EKG was not changed from prior. ? ?Electrolytes significant for correct calcium of 13.6 with prior value approximately one month ago of 9.6.Marland Kitchen. This is a significant change over t

## 2021-09-30 NOTE — Plan of Care (Addendum)
Plan of Care Note for accepted transfer ? ? ?Patient: [name] MRN: [id number]   DOA: 09/30/2021 ? ?Facility requesting transfer: [facility] ?Requesting Provider: Dr. Dalene Seltzer ?Reason for transfer: Generalized weakness, fatigue, lightheadedness, hypercalcemia ? ?Facility course:  ? ?51 year old female with a past medical history of T1DM follows with [facility].  Presented to [facility] with several complaints including fatigue, lightheadedness, dizziness, generalized weakness, bilateral lower extremity pain, nausea. Since Wednesday. Found to have hypercalcemia and being given IV fluids and Lasix.  Intact PTH pending.  Chest x-ray no acute findings. CT head no acute findings.  ED provider wanting admission for possible symptomatic hypercalcemia. The patient recently started Vit D supplementation at home. Unclear what dose. Noted to have elevated BG of 500 at home. Glucosuria in the ED. Not in DKA. ? ?Plan of care: ?The patient is accepted for admission to Med-surg  unit, at [facility] under observation status. Nurse at [facility] reports some vomiting at home. Consider CT imaging of abdomen/pelvis. Follow up on labs. Continue IV fluids. Obtain A1C. Repeat BMP. ? ?Author: ?Verdia Kuba, DO ?09/30/2021 ? ?Check [email or web address] for on-call coverage. ? ?Nursing staff, Please call [facility] Admits [facility] Consults System-Wide number on Amion as soon as patient's arrival, so appropriate admitting provider can evaluate the pt. ? ?

## 2021-09-30 NOTE — ED Notes (Signed)
Pt. Unable to urinate at present time. ?

## 2021-09-30 NOTE — Assessment & Plan Note (Addendum)
Vit D was initially considered top of differential.  On both Daily and weekly Vit D for past month, see HPI. ?1. Vit d level is elevated at nearly 92 ?2. PTH low ?3. Had discussed with Nephrology. Recommendation for addition of pamidronate and calcitonin ?4. Calcium improved to 10.1, corrected to 10.7 ?5. Discussed with pharmacy, held further calcitonin ?

## 2021-09-30 NOTE — Discharge Instructions (Addendum)
In consideration of your symptoms of feeling weak, nausea, vomiting clear liquid, loss of appetite along with the significant volume of sugar found in your urine and your elevated blood sugar at this time despite your not having eaten anything today, I strongly encourage you to reach out to your diabetes doctor to discuss what to do next.  I am not qualified to make those recommendations here at urgent care.  If your symptoms significantly worsen in the next 24 hours or before you hear from your diabetes doctor, I would also strongly encourage you to go to the emergency room for more rapid evaluation.  At urgent care, we are unable to perform labs in a timely fashion (our lab results can take 24 to 48 hours) and this may be required if your symptoms worsen. ? ?Thank you for visiting urgent care today. ? ?You had greater than 1000 mg/dL of glucose in your urine and your fingerstick blood sugar level today was 297. ?

## 2021-09-30 NOTE — ED Notes (Signed)
Pt. Has had crackers and orange juice for her blood sugar today.   ?

## 2021-10-01 DIAGNOSIS — E1042 Type 1 diabetes mellitus with diabetic polyneuropathy: Secondary | ICD-10-CM | POA: Diagnosis not present

## 2021-10-01 LAB — VITAMIN D 25 HYDROXY (VIT D DEFICIENCY, FRACTURES): Vit D, 25-Hydroxy: 91.67 ng/mL (ref 30–100)

## 2021-10-01 LAB — COMPREHENSIVE METABOLIC PANEL
ALT: 18 U/L (ref 0–44)
AST: 17 U/L (ref 15–41)
Albumin: 3.2 g/dL — ABNORMAL LOW (ref 3.5–5.0)
Alkaline Phosphatase: 78 U/L (ref 38–126)
Anion gap: 5 (ref 5–15)
BUN: 19 mg/dL (ref 6–20)
CO2: 30 mmol/L (ref 22–32)
Calcium: 12.5 mg/dL — ABNORMAL HIGH (ref 8.9–10.3)
Chloride: 102 mmol/L (ref 98–111)
Creatinine, Ser: 0.97 mg/dL (ref 0.44–1.00)
GFR, Estimated: >60 mL/min (ref >60)
Glucose, Bld: 197 mg/dL — ABNORMAL HIGH (ref 70–99)
Potassium: 3.6 mmol/L (ref 3.5–5.1)
Sodium: 137 mmol/L (ref 135–145)
Total Bilirubin: 0.4 mg/dL (ref 0.3–1.2)
Total Protein: 7.5 g/dL (ref 6.5–8.1)

## 2021-10-01 LAB — PTH, INTACT AND CALCIUM
Calcium, Total (PTH): 12.5 mg/dL — ABNORMAL HIGH (ref 8.7–10.2)
PTH: 4 pg/mL — ABNORMAL LOW (ref 15–65)

## 2021-10-01 LAB — GLUCOSE, CAPILLARY
Glucose-Capillary: 155 mg/dL — ABNORMAL HIGH (ref 70–99)
Glucose-Capillary: 205 mg/dL — ABNORMAL HIGH (ref 70–99)

## 2021-10-01 LAB — HIV ANTIBODY (ROUTINE TESTING W REFLEX): HIV Screen 4th Generation wRfx: REACTIVE — AB

## 2021-10-01 MED ORDER — PANTOPRAZOLE SODIUM 40 MG PO TBEC
40.0000 mg | DELAYED_RELEASE_TABLET | Freq: Every day | ORAL | Status: DC
  Administered 2021-10-01: 40 mg via ORAL
  Filled 2021-10-01: qty 1

## 2021-10-01 MED ORDER — SODIUM CHLORIDE 0.9 % IV SOLN: INTRAVENOUS | Status: AC

## 2021-10-01 MED ORDER — PANTOPRAZOLE SODIUM 40 MG PO TBEC
40.0000 mg | DELAYED_RELEASE_TABLET | Freq: Two times a day (BID) | ORAL | Status: DC
Start: 2021-10-02 — End: 2021-10-08
  Administered 2021-10-02 – 2021-10-07 (×11): 40 mg via ORAL
  Filled 2021-10-01 (×12): qty 1

## 2021-10-01 MED ORDER — SODIUM CHLORIDE 0.9 % IV SOLN: Freq: Once | INTRAVENOUS | Status: AC

## 2021-10-01 NOTE — Plan of Care (Signed)

## 2021-10-01 NOTE — Progress Notes (Addendum)
?PROGRESS NOTE ? ? ? ?Illiana Wade  CBS:[id number] DOB: 07-28-1970 DOA: 09/30/2021 ?PCP: Clemencia Course, PA-C  ? ? ?Brief Narrative:  ?[name] is a 51 y.o. female with past medical history of diabetes presented to the hospital with complaints of fatigue dizziness generalized weakness nausea epigastric discomfort.  Patient was taking vitamin D at home and was taking weekly dose more frequently.  In the ED patient was noted to be hypercalcemic.  Received IV fluid and Lasix and was admitted hospital for symptomatic hypercalcemia likely from vitamin D intoxication ?  ?Assessment and Plan: ?*Symptomatic hypercalcemia ?Likely secondary to vitamin D toxicity.  Still symptomatic with epigastric discomfort dizziness redness and thirsty.  Continue with IV fluids.  Increase the normal saline to 150 mill per hour.  Monitor intake and output charting.  Patient was counseled against stopping vitamin D for a while.  Encouraged oral hydration.  Vitamin D level.  Check BMP in AM.  Calcium level of 12.5 today from 13.4 yesterday.  We will continue to monitor. ? ?Type 1 diabetes mellitus with diabetic polyneuropathy (HCC) ?Patient takes 50u daily of glargine in AM + 20-25 mealtime novolog TID at home, continue while in the hospital. ? ?Epigastric  discomfort.  We will add PPI.  Avoid calcium-containing antacids.  Encouraged oral hydration. ? ? ? DVT prophylaxis: enoxaparin (LOVENOX) injection 40 mg Start: 09/30/21 2200 ? ? ?Code Status:   ?  Code Status: Full Code ? ?Disposition: Home likely in 1 to 2 days ? ?Status is: Observation ? ?The patient will require care spanning > 2 midnights and should be moved to inpatient because: IV fluids, symptomatic hypercalcemia, and need for further monitoring  ? ? Family Communication:  ?Spoke with the patient at bedside.  I also spoke with the patient's son-in-law who is an ED physician and updated him about the clinical condition of the patient. ? ?Consultants:  ?None ? ?Procedures:   ?None ? ?Antimicrobials:  ?None ? ?Anti-infectives (From admission, onward)  ? ? None  ? ?  ? ?Subjective: ?Today, patient was seen and examined at bedside.  Still complains of thirsty epigastric discomfort dizziness lightheadedness.  States that she has not been urinating much. ? ?Objective: ?Vitals:  ? 09/30/21 2100 10/01/21 0053 10/01/21 0504 10/01/21 0900  ?BP: (!) 144/76 133/73 137/82 132/74  ?Pulse: 75 71 62 65  ?Resp: 16 20 16 16   ?Temp: 98.2 ?F (36.8 ?C) 98.3 ?F (36.8 ?C) 97.9 ?F (36.6 ?C) 97.9 ?F (36.6 ?C)  ?TempSrc: Oral Oral Oral Oral  ?SpO2: 100% 96% 99% 98%  ?Weight:      ?Height:      ? ? ?Intake/Output Summary (Last 24 hours) at 10/01/2021 1014 ?Last data filed at 10/01/2021 0900 ?Gross per 24 hour  ?Intake 1763.99 ml  ?Output 1000 ml  ?Net 763.99 ml  ? ?Filed Weights  ? 09/30/21 1239 09/30/21 2052  ?Weight: 56.7 kg 58.9 kg  ? ? ?Physical Examination: ?Body mass index is 25.36 kg/m?.  ?General:  Average built, not in obvious distress ?HENT:   No scleral pallor or icterus noted. Oral mucosa is mildly dry. ?Chest:  Clear breath sounds.  Diminished breath sounds bilaterally. No crackles or wheezes.  ?CVS: S1 &S2 heard. No murmur.  Regular rate and rhythm. ?Abdomen: Soft, mild epigastric discomfort nondistended.  Bowel sounds are heard.   ?Extremities: No cyanosis, clubbing or edema.  Peripheral pulses are palpable. ?Psych: Alert, awake and oriented, normal mood ?CNS:  No cranial nerve deficits.  Power equal in  all extremities.   ?Skin: Warm and dry.  No rashes noted. ? ?Data Reviewed:  ? ?CBC: ?Recent Labs  ?Lab 09/30/21 ?1331 09/30/21 ?1332  ?WBC  --  8.1  ?NEUTROABS  --  4.8  ?HGB 12.9 12.2  ?HCT 38.0 36.7  ?MCV  --  81.9  ?PLT  --  233  ? ? ?Basic Metabolic Panel: ?Recent Labs  ?Lab 09/30/21 ?1331 09/30/21 ?1332 10/01/21 ?0102  ?NA 136 135 137  ?K 4.2 3.7 3.6  ?CL  --  97* 102  ?CO2  --  30 30  ?GLUCOSE  --  191* 197*  ?BUN  --  20 19  ?CREATININE  --  0.98 0.97  ?CALCIUM  --  13.4* 12.5*  ? ? ?Liver  Function Tests: ?Recent Labs  ?Lab 09/30/21 ?1332 10/01/21 ?7253  ?AST 19 17  ?ALT 21 18  ?ALKPHOS 94 78  ?BILITOT 0.6 0.4  ?PROT 9.1* 7.5  ?ALBUMIN 3.8 3.2*  ? ? ? ?Radiology Studies: ?DG Chest 2 View ? ?Result Date: 09/30/2021 ?CLINICAL DATA:  Nausea, fatigue EXAM: CHEST - 2 VIEW COMPARISON:  None. FINDINGS: Normal mediastinum and cardiac silhouette. Normal pulmonary vasculature. No evidence of effusion, infiltrate, or pneumothorax. No acute bony abnormality. IMPRESSION: No acute cardiopulmonary process. Electronically Signed   By: Genevive Bi M.D.   On: 09/30/2021 15:31  ? ?CT Head Wo Contrast ? ?Result Date: 09/30/2021 ?CLINICAL DATA:  Headache.  Fatigue. EXAM: CT HEAD WITHOUT CONTRAST TECHNIQUE: Contiguous axial images were obtained from the base of the skull through the vertex without intravenous contrast. RADIATION DOSE REDUCTION: This exam was performed according to the departmental dose-optimization program which includes automated exposure control, adjustment of the mA and/or kV according to patient size and/or use of iterative reconstruction technique. COMPARISON:  None. FINDINGS: Brain: No mass lesion, hemorrhage, hydrocephalus, acute infarct, intra-axial, or extra-axial fluid collection. Basal ganglia calcifications are likely physiologic. Vascular: No hyperdense vessel or unexpected calcification. Skull: Normal Sinuses/Orbits: Normal imaged portions of the orbits and globes. Clear paranasal sinuses and mastoid air cells. Other: None. IMPRESSION: No acute intracranial abnormality. Basal ganglia calcifications, most likely physiologic in this 51 year old patient. Electronically Signed   By: Jeronimo Greaves M.D.   On: 09/30/2021 15:28   ? ? ? LOS: 0 days  ? ? ?Joycelyn Das, MD ?[facility] ?10/01/2021, 10:14 AM  ? ? ?

## 2021-10-01 NOTE — Plan of Care (Signed)
  Problem: Pain Managment: Goal: General experience of comfort will improve Outcome: Progressing   

## 2021-10-02 ENCOUNTER — Encounter (HOSPITAL_COMMUNITY): Payer: Self-pay | Admitting: Internal Medicine

## 2021-10-02 ENCOUNTER — Inpatient Hospital Stay (HOSPITAL_COMMUNITY): Payer: 59

## 2021-10-02 ENCOUNTER — Ambulatory Visit: Payer: 59 | Admitting: Internal Medicine

## 2021-10-02 DIAGNOSIS — K769 Liver disease, unspecified: Secondary | ICD-10-CM | POA: Diagnosis not present

## 2021-10-02 DIAGNOSIS — Z794 Long term (current) use of insulin: Secondary | ICD-10-CM | POA: Diagnosis not present

## 2021-10-02 DIAGNOSIS — T452X5A Adverse effect of vitamins, initial encounter: Secondary | ICD-10-CM | POA: Diagnosis present

## 2021-10-02 DIAGNOSIS — E1042 Type 1 diabetes mellitus with diabetic polyneuropathy: Secondary | ICD-10-CM | POA: Diagnosis present

## 2021-10-02 DIAGNOSIS — Z20822 Contact with and (suspected) exposure to covid-19: Secondary | ICD-10-CM | POA: Diagnosis present

## 2021-10-02 DIAGNOSIS — R531 Weakness: Secondary | ICD-10-CM | POA: Diagnosis not present

## 2021-10-02 DIAGNOSIS — E673 Hypervitaminosis D: Secondary | ICD-10-CM | POA: Diagnosis present

## 2021-10-02 DIAGNOSIS — K5900 Constipation, unspecified: Secondary | ICD-10-CM | POA: Diagnosis present

## 2021-10-02 DIAGNOSIS — K7689 Other specified diseases of liver: Secondary | ICD-10-CM | POA: Diagnosis present

## 2021-10-02 DIAGNOSIS — Z21 Asymptomatic human immunodeficiency virus [HIV] infection status: Secondary | ICD-10-CM

## 2021-10-02 DIAGNOSIS — R110 Nausea: Secondary | ICD-10-CM | POA: Diagnosis not present

## 2021-10-02 DIAGNOSIS — Z79899 Other long term (current) drug therapy: Secondary | ICD-10-CM | POA: Diagnosis not present

## 2021-10-02 DIAGNOSIS — D7389 Other diseases of spleen: Secondary | ICD-10-CM | POA: Diagnosis present

## 2021-10-02 LAB — BASIC METABOLIC PANEL
Anion gap: 5 (ref 5–15)
BUN: 14 mg/dL (ref 6–20)
CO2: 26 mmol/L (ref 22–32)
Calcium: 12.1 mg/dL — ABNORMAL HIGH (ref 8.9–10.3)
Chloride: 107 mmol/L (ref 98–111)
Creatinine, Ser: 0.84 mg/dL (ref 0.44–1.00)
GFR, Estimated: >60 mL/min (ref >60)
Glucose, Bld: 180 mg/dL — ABNORMAL HIGH (ref 70–99)
Potassium: 3.4 mmol/L — ABNORMAL LOW (ref 3.5–5.1)
Sodium: 138 mmol/L (ref 135–145)

## 2021-10-02 LAB — GLUCOSE, CAPILLARY
Glucose-Capillary: 146 mg/dL — ABNORMAL HIGH (ref 70–99)
Glucose-Capillary: 174 mg/dL — ABNORMAL HIGH (ref 70–99)
Glucose-Capillary: 266 mg/dL — ABNORMAL HIGH (ref 70–99)
Glucose-Capillary: 275 mg/dL — ABNORMAL HIGH (ref 70–99)
Glucose-Capillary: 281 mg/dL — ABNORMAL HIGH (ref 70–99)
Glucose-Capillary: 206 mg/dL — ABNORMAL HIGH (ref 70–99)

## 2021-10-02 LAB — CBC
HCT: 30.5 % — ABNORMAL LOW (ref 36.0–46.0)
Hemoglobin: 9.9 g/dL — ABNORMAL LOW (ref 12.0–15.0)
MCH: 27 pg (ref 26.0–34.0)
MCHC: 32.5 g/dL (ref 30.0–36.0)
MCV: 83.3 fL (ref 80.0–100.0)
Platelets: 250 10*3/uL (ref 150–400)
RBC: 3.66 MIL/uL — ABNORMAL LOW (ref 3.87–5.11)
RDW: 13.5 % (ref 11.5–15.5)
WBC: 6.9 10*3/uL (ref 4.0–10.5)
nRBC: 0 % (ref 0.0–0.2)

## 2021-10-02 LAB — MAGNESIUM: Magnesium: 1.6 mg/dL — ABNORMAL LOW (ref 1.7–2.4)

## 2021-10-02 MED ORDER — MAGNESIUM SULFATE 2 GM/50ML IV SOLN
2.0000 g | Freq: Once | INTRAVENOUS | Status: AC
  Administered 2021-10-02: 2 g via INTRAVENOUS
  Filled 2021-10-02: qty 50

## 2021-10-02 MED ORDER — BISACODYL 10 MG RE SUPP: 10.0000 mg | Freq: Once | RECTAL | Status: DC

## 2021-10-02 MED ORDER — POTASSIUM CHLORIDE CRYS ER 20 MEQ PO TBCR
40.0000 meq | EXTENDED_RELEASE_TABLET | Freq: Once | ORAL | Status: AC
  Administered 2021-10-02: 40 meq via ORAL
  Filled 2021-10-02: qty 2

## 2021-10-02 MED ORDER — IOHEXOL 300 MG/ML  SOLN
100.0000 mL | Freq: Once | INTRAMUSCULAR | Status: AC | PRN
  Administered 2021-10-02: 100 mL via INTRAVENOUS

## 2021-10-02 MED ORDER — SODIUM CHLORIDE (PF) 0.9 % IJ SOLN
INTRAMUSCULAR | Status: AC
  Filled 2021-10-02: qty 50

## 2021-10-02 MED ORDER — DOCUSATE SODIUM 100 MG PO CAPS
100.0000 mg | ORAL_CAPSULE | Freq: Two times a day (BID) | ORAL | Status: DC
  Administered 2021-10-02 – 2021-10-08 (×10): 100 mg via ORAL
  Filled 2021-10-02 (×13): qty 1

## 2021-10-02 MED ORDER — INSULIN ASPART 100 UNIT/ML IJ SOLN
5.0000 [IU] | Freq: Once | INTRAMUSCULAR | Status: AC
  Administered 2021-10-02: 5 [IU] via SUBCUTANEOUS

## 2021-10-02 MED ORDER — SODIUM CHLORIDE 0.9 % IV SOLN: INTRAVENOUS | Status: AC

## 2021-10-02 MED ORDER — POLYETHYLENE GLYCOL 3350 17 G PO PACK
17.0000 g | PACK | Freq: Every day | ORAL | Status: DC
  Administered 2021-10-02 – 2021-10-03 (×2): 17 g via ORAL
  Filled 2021-10-02 (×2): qty 1

## 2021-10-02 NOTE — Clinical Social Work Note (Signed)
?  Transition of Care (TOC) Screening Note ? ? ?Patient Details  ?Name: [name] ?Date of Birth: 1971-05-03 ? ? ?Transition of Care (TOC) CM/SW Contact:    ?Ida Rogue, LCSW ?Phone Number: ?10/02/2021, 1:47 PM ? ? ? ?Transition of Care Department [facility]) has reviewed patient and no TOC needs have been identified at this time. We will continue to monitor patient advancement through interdisciplinary progression rounds. If new patient transition needs arise, please place a TOC consult. ? ? ?

## 2021-10-02 NOTE — Progress Notes (Signed)
Pt completed oral contrast at 2100. Radiology notified. ?

## 2021-10-02 NOTE — Progress Notes (Signed)
?  Progress Note ? ? ?PatientSigrid Wade [id number] DOB: April 28, 1971 DOA: 09/30/2021     0 ?DOS: the patient was seen and examined on 10/02/2021 ?  ?Brief hospital course: ?51 y.o. female with past medical history of diabetes presented to the hospital with complaints of fatigue dizziness generalized weakness nausea epigastric discomfort.  Patient was taking vitamin D at home and was taking weekly dose more frequently.  In the ED patient was noted to be hypercalcemic.  Received IV fluid and Lasix and was admitted hospital for symptomatic hypercalcemia likely from vitamin D intoxication ? ?Assessment and Plan: ?* Hypercalcemia ?Vit D toxicity is top of differential.  On both Daily and weekly Vit D for past month, see HPI. ?Vit d level is borderline elevated ?PTH low ?NS ?Got 1 dose of lasix in ED ?Ca improved, but is still elevated at over 12. Cont IVF as tolerated ?Recheck bmet in AM. ?Did let pt and family know that this was my suspicion and usually someone should be taking JUST daily OR weekly vit D (not both). ? 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 ?Type 1 diabetes mellitus with diabetic polyneuropathy (HCC) ?Takes 50u daily of glargine in AM + 20-25 mealtime novolog TID at home ?Currently on 50u daily of glargine ?6u mealtime novolog ?Cont with SSI TID as needed while in hospital ? ?Glycemic trends stable, albeit suboptimally controlled ?Will increase glargine to 55 units ? ? ? ?  ? ?Subjective: Complaining of abd discomfort and nausea ? ?Physical Exam: ?Vitals:  ? 10/01/21 1331 10/01/21 1911 10/02/21 0507 10/02/21 1252  ?BP: (!) 151/86 (!) 146/83 (!) 161/77 (!) 151/73  ?Pulse: 77 75 65 66  ?Resp: (!) 22 (!) 22 16 18   ?Temp: 98.2 ?F (36.8 ?C) 98 ?F (36.7 ?C) 98.1 ?F (36.7 ?C) 98.1 ?F (36.7 ?C)  ?TempSrc: Oral Oral Oral Oral  ?SpO2: 99% 99% 99% 100%  ?Weight:      ?Height:      ? ?General exam: Awake, laying in bed, in nad ?Respiratory system: Normal respiratory  effort, no wheezing ?Cardiovascular system: regular rate, s1, s2 ?Gastrointestinal system: Soft, nondistended, positive BS ?Central nervous system: CN2-12 grossly intact, strength intact ?Extremities: Perfused, no clubbing ?Skin: Normal skin turgor, no notable skin lesions seen ?Psychiatry: Mood normal // no visual hallucinations  ? ?Data Reviewed: ? ?Labs reviewed, Ca 12.1, PTH 4 ? ?Family Communication: Pt in room, family not at bedside ? ?Disposition: ?Status is: Observation ?The patient will require care spanning > 2 midnights and should be moved to inpatient because: Severity of illness ? Planned Discharge Destination: Home ? ? ? ? ?Author: ?Marylu Lund, MD ?10/02/2021 2:59 PM ? ?For on call review [email or web address].  ?

## 2021-10-02 NOTE — Hospital Course (Signed)
51 y.o. female with past medical history of diabetes presented to the hospital with complaints of fatigue dizziness generalized weakness nausea epigastric discomfort.  Patient was taking vitamin D at home and was taking weekly dose more frequently.  In the ED patient was noted to be hypercalcemic.  Received IV fluid and Lasix and was admitted hospital for symptomatic hypercalcemia likely from vitamin D intoxication ?

## 2021-10-02 NOTE — Plan of Care (Signed)

## 2021-10-02 NOTE — Assessment & Plan Note (Addendum)
ID had been following ?Afebrile ?HIV-1 and HIV-2 later confirmed neg ?RPR NR ?AFB pending ?Hep B and Hep C neg ?

## 2021-10-02 NOTE — Progress Notes (Deleted)
?Name: [name]  ?MRN/ DOB: [id number], 02/03/1971   ?Age/ Sex: 51 y.o., female   ? ?PCP: Clemencia CourseHancock, Madison Frazier, PA-C   ?Reason for Endocrinology Evaluation: Type 1 Diabetes Mellitus  ?   ?Date of Initial Endocrinology Visit: 08/22/2021  ? ? ?PATIENT IDENTIFIER: [name] is a 51 y.o. female with a past medical history of T1DM. The patient presented for initial endocrinology clinic visit on 08/22/2021  for consultative assistance with her diabetes management.  ? ? ?HPI: ?[name] was  ? ? ?Diagnosed with DM in 2010  ?Hemoglobin A1c has ranged from 8.5% in 2021, peaking at 10.7% in 2017. ? ? ? ? ?[facility], was last seen there in 2021 ? ? ?Has body aches all over  ?She is on vitamin D daily - unknown dose  ?hair falling  ?Has burning of the feet at night, wakes her up  ? ? ? ?SUBJECTIVE:  ? ?During the last visit (08/22/2021): A1c 8.2% we adjusted MDI regimen ? ?Today (10/02/21): [name] is here for follow-up on diabetes management.@CAPHE @ checks @HIS @ blood sugars *** times daily, preprandial to breakfast and ***. The patient has *** had hypoglycemic episodes since the last clinic visit, which typically occur *** x / - most often occuring ***. The patient is *** symptomatic with these episodes, with symptoms of {symptoms; hypoglycemia:9084048}.  ? ? ?HOME DIABETES REGIMEN: ?Toujeo 46 units daily in AM  ?Novolog 20 units with each meal  ?Correction factor: NovoLog(BG -130/30) ?Gabapentin 100 mg daily ?Vitamin D3 2000 IU daily ? ? ?Statin: no ?ACE-I/ARB: yes ? ?METER DOWNLOAD SUMMARY: Did not bring  ? ? ? ? ?DIABETIC COMPLICATIONS: ?Microvascular complications:  ?Neuropathy ?Denies: CKD, retinopathy  ?Last eye exam: Completed 2021 ? ?Macrovascular complications:  ? ?Denies: CAD, PVD, CVA ? ? ?PAST HISTORY: ?Past Medical History:  ?Past Medical History:  ?Diagnosis Date  ? Type 2 diabetes mellitus (HCC)   ? ?Past Surgical History:  ?Past Surgical History:  ?Procedure Laterality  Date  ? CESAREAN SECTION  03/05/2012  ? Procedure: CESAREAN SECTION;  Surgeon: Fortino SicEleanor E Greene, MD;  Location: [facility];  Service: Obstetrics;  Laterality: N/A;  ? ?  ?Social History:  reports that she has never smoked. She does not have any smokeless tobacco history on file. She reports that she does not drink alcohol and does not use drugs. ?Family History: No family history on file. ? ? ?HOME MEDICATIONS: ?Allergies as of 10/02/2021   ?No Known Allergies ?  ? ?  ?Medication List  ?  ? ? Notice   ?This visit is during an admission. Changes to the med list made in this visit will be reflected in the After Visit Summary of the admission. ?  ? ? ? ?ALLERGIES: ?No Known Allergies ? ? ?REVIEW OF SYSTEMS: ?A comprehensive ROS was conducted with the patient and is negative except as per HPI and below:  ?ROS ? ?  ?OBJECTIVE:  ? ?VITAL SIGNS: There were no vitals taken for this visit. ? ? ?PHYSICAL EXAM:  ?General: Pt appears well and is in NAD  ?Neck: General: Supple without adenopathy or carotid bruits. ?Thyroid: Thyroid size normal.  No goiter or nodules appreciated.   ?Lungs: Clear with good BS bilat with no rales, rhonchi, or wheezes  ?Heart: RRR with normal S1 and S2 and no gallops; no murmurs; no rub  ?Abdomen: Normoactive bowel sounds, soft, nontender, without masses or organomegaly palpable  ?Extremities:  ?Lower extremities - No pretibial edema.  No lesions.  ?Neuro: MS is good with appropriate affect, pt is alert and Ox3  ? ? ?DM foot exam: 08/22/2021 ? ?The skin of the feet is intact without sores or ulcerations. ?The pedal pulses are 2+ on right and 2+ on left. ?The sensation is intact to a screening 5.07, 10 gram monofilament bilaterally ? ? ?DATA REVIEWED: ? ? Latest Reference Range & Units 08/22/21 09:25  ?Sodium 135 - 145 mEq/L 135  ?Potassium 3.5 - 5.1 mEq/L 4.6  ?Chloride 96 - 112 mEq/L 100  ?CO2 19 - 32 mEq/L 30  ?Glucose 70 - 99 mg/dL 671 (H)  ?BUN 6 - 23 mg/dL 17  ?Creatinine 0.40 - 1.20 mg/dL 2.45  ?Calcium  8.4 - 10.5 mg/dL 9.7  ?GFR >60.00 mL/min 92.53  ?Total CHOL/HDL Ratio  4  ?Cholesterol 0 - 200 mg/dL 809  ?HDL Cholesterol >39.00 mg/dL 98.33 (L)  ?LDL (calc) 0 - 99 mg/dL 825 (H)  ?MICROALB/CREAT RATIO 0.0 - 30.0 mg/g 1.7  ?NonHDL  128.47  ?Triglycerides 0.0 - 149.0 mg/dL 053.9  ?VLDL 0.0 - 40.0 mg/dL 76.7  ? ? Latest Reference Range & Units 08/22/21 09:25  ?VITD 30.00 - 100.00 ng/mL 24.73 (L)  ? ? ? Latest Reference Range & Units 08/22/21 09:25  ?TSH 0.35 - 5.50 uIU/mL 1.03  ?T4,Free(Direct) 0.60 - 1.60 ng/dL 3.41  ? ? Latest Reference Range & Units 08/22/21 09:25  ?Creatinine,U mg/dL 93.7  ?Microalb, Ur 0.0 - 1.9 mg/dL <9.0  ?MICROALB/CREAT RATIO 0.0 - 30.0 mg/g 1.7  ? ? ? ?ASSESSMENT / PLAN / RECOMMENDATIONS:  ? ?1) Type 1 Diabetes Mellitus, Poorly controlled, With neuropathic complications - Most recent A1c of 8.2 %. Goal A1c < 7.0 %.   ? ?She was unable to understand the concept of carb counting  ?Due to reported hypoglycemia around 2 AM , no data available for me I am going to reduce Toujeo ?No changes to her prandial dose of insulin again due to lack of glucose data ?She will be provided with a correction scale ?Freestyle Libre 2's sent to the pharmacy ? ?MEDICATIONS: ?Decrease Toujeo 46 units daily ?Continue NovoLog 20 units with each meal ?Correction factor: NovoLog(BG -130/30) ? ?EDUCATION / INSTRUCTIONS: ?BG monitoring instructions: Patient is instructed to check her blood sugars 3 times a day, before meals . ?Call [facility] if: BG persistently < 70  ?I reviewed the Rule of 15 for the treatment of hypoglycemia in detail with the patient. Literature supplied. ? ? ?2) Diabetic complications:  ?Eye: Does not have known diabetic retinopathy.  ?Neuro/ Feet: Does  have known diabetic peripheral neuropathy. ?Renal: Patient does not have known baseline CKD. She is  on an ACEI/ARB at present. ? ?3) Dyslipidemia: ? ? ?-LDL above goal at 102 mg/DL.  We will emphasize importance of low-fat diet  and will discuss statin therapy on the next visit ? ? ?4) vitamin D insufficiency: ? ?-Patient will be advised to start OTC vitamin D3 at 2000 IUs daily ? ?5) Peripheral Neuropathy: ? ?-Patient states she is on small dose of gabapentin and she continues to wake up at night with leg pains ?-We will increase gabapentin, cautioned against drowsiness ? ? ? ?Medication ?gabapentin 100 mg nightly ? ? ?Follow-up in 3 months ? ?Signed electronically by: ?Abby Raelyn Mora, MD ? ?[facility]  ?[facility] ?[address] ?[place], [place] [address] ?Phone: [phone] ?FAX: [phone]  ? ?CC: ?Clemencia Course, PA-C ?[phone] ?  [place] [address] ?Phone: [phone]  ?Fax: [phone] ? ? ? ?Return to Endocrinology clinic as below: ?Future Appointments  ?Date Time Provider Department Center  ?10/02/2021  9:10 AM Braycen Burandt, Konrad Dolores, MD [facility] None  ?11/22/2021  9:10 AM Natalye Kott, Konrad Dolores, MD [facility] None  ?  ? ?

## 2021-10-03 ENCOUNTER — Encounter (HOSPITAL_COMMUNITY): Payer: Self-pay | Admitting: Internal Medicine

## 2021-10-03 ENCOUNTER — Inpatient Hospital Stay (HOSPITAL_COMMUNITY): Payer: 59

## 2021-10-03 DIAGNOSIS — D7389 Other diseases of spleen: Secondary | ICD-10-CM

## 2021-10-03 DIAGNOSIS — K769 Liver disease, unspecified: Secondary | ICD-10-CM

## 2021-10-03 DIAGNOSIS — Z21 Asymptomatic human immunodeficiency virus [HIV] infection status: Secondary | ICD-10-CM | POA: Diagnosis not present

## 2021-10-03 DIAGNOSIS — R531 Weakness: Secondary | ICD-10-CM | POA: Diagnosis not present

## 2021-10-03 LAB — BASIC METABOLIC PANEL
Anion gap: 5 (ref 5–15)
BUN: 14 mg/dL (ref 6–20)
CO2: 25 mmol/L (ref 22–32)
Calcium: 11.7 mg/dL — ABNORMAL HIGH (ref 8.9–10.3)
Chloride: 108 mmol/L (ref 98–111)
Creatinine, Ser: 0.84 mg/dL (ref 0.44–1.00)
GFR, Estimated: >60 mL/min (ref >60)
Glucose, Bld: 201 mg/dL — ABNORMAL HIGH (ref 70–99)
Potassium: 3.6 mmol/L (ref 3.5–5.1)
Sodium: 138 mmol/L (ref 135–145)

## 2021-10-03 LAB — HEPATITIS B CORE ANTIBODY, TOTAL: Hep B Core Total Ab: NONREACTIVE

## 2021-10-03 LAB — HIV-1 RNA QUANT-NO REFLEX-BLD
HIV 1 RNA Quant: <20 copies/mL
LOG10 HIV-1 RNA: UNDETERMINED log10copy/mL

## 2021-10-03 LAB — HIV-1/HIV-2 QUALITATIVE RNA
HIV-1 RNA, Qualitative: NONREACTIVE
HIV-2 RNA, Qualitative: NONREACTIVE

## 2021-10-03 LAB — MAGNESIUM: Magnesium: 1.8 mg/dL (ref 1.7–2.4)

## 2021-10-03 LAB — HIV-1/2 AB - DIFFERENTIATION
HIV 1 Ab: NONREACTIVE
HIV 2 Ab: UNDETERMINED

## 2021-10-03 LAB — GLUCOSE, CAPILLARY
Glucose-Capillary: 201 mg/dL — ABNORMAL HIGH (ref 70–99)
Glucose-Capillary: 212 mg/dL — ABNORMAL HIGH (ref 70–99)

## 2021-10-03 MED ORDER — BISACODYL 5 MG PO TBEC
5.0000 mg | DELAYED_RELEASE_TABLET | Freq: Once | ORAL | Status: AC
  Administered 2021-10-03: 5 mg via ORAL
  Filled 2021-10-03: qty 1

## 2021-10-03 MED ORDER — INSULIN ASPART 100 UNIT/ML IJ SOLN
10.0000 [IU] | Freq: Three times a day (TID) | INTRAMUSCULAR | Status: DC
  Administered 2021-10-03 – 2021-10-06 (×8): 10 [IU] via SUBCUTANEOUS

## 2021-10-03 MED ORDER — ENSURE MAX PROTEIN PO LIQD
11.0000 [oz_av] | Freq: Two times a day (BID) | ORAL | Status: DC
  Administered 2021-10-03 – 2021-10-08 (×8): 11 [oz_av] via ORAL
  Filled 2021-10-03 (×11): qty 330

## 2021-10-03 MED ORDER — IOHEXOL 300 MG/ML  SOLN
75.0000 mL | Freq: Once | INTRAMUSCULAR | Status: AC | PRN
  Administered 2021-10-03: 75 mL via INTRAVENOUS

## 2021-10-03 MED ORDER — POLYETHYLENE GLYCOL 3350 17 G PO PACK
17.0000 g | PACK | Freq: Two times a day (BID) | ORAL | Status: DC
  Administered 2021-10-03 – 2021-10-08 (×5): 17 g via ORAL
  Filled 2021-10-03 (×10): qty 1

## 2021-10-03 MED ORDER — INSULIN GLARGINE-YFGN 100 UNIT/ML ~~LOC~~ SOLN
55.0000 [IU] | Freq: Every day | SUBCUTANEOUS | Status: DC
  Administered 2021-10-04: 55 [IU] via SUBCUTANEOUS
  Filled 2021-10-03: qty 0.55

## 2021-10-03 MED ORDER — ADULT MULTIVITAMIN W/MINERALS CH
1.0000 | ORAL_TABLET | Freq: Every day | ORAL | Status: DC
  Administered 2021-10-04 – 2021-10-08 (×5): 1 via ORAL
  Filled 2021-10-03 (×5): qty 1

## 2021-10-03 NOTE — Consult Note (Signed)
[place]  ?[phone]) [phone] [phone]) [phone]  ? ?MEDICAL ONCOLOGY - INITIAL CONSULTATION ? ?Referral MD: Dr. Elmarie Mainland ? ?Reason for Referral: Retroperitoneal and inguinopelvic lymphadenopathy, hypodense lesions seen in the spleen and liver, hypercalcemia ? ?HPI: [name] is a 51 year old female with a past medical history significant for insulin-dependent diabetes mellitus.  She presented to the emergency department with dizziness.  In the ED, she complained of dizziness, fatigue, generalized weakness, and nausea.  Was found to have hypercalcemia and received IV fluids and Lasix.  On admission, her CBC was normal, Leukos 191, calcium 13.4, with a normal albumin of 3.8, total protein was elevated at 9.1.  CT of the head showed no acute intracranial abnormality but did show basal ganglia calcifications which are most likely physiologic.  Chest x-ray did not show an acute cardiopulmonary process.  A CT of the abdomen/pelvis with contrast was performed on 10/02/2021 which showed innumerable hypodense lesions replacing the splenic parenchyma with mild splenomegaly, innumerable tiny hypodensities throughout the hepatic parenchyma compatible with diffuse parenchymal disease whether chronic infectious such as due to sclerosing cholangitis or diffuse infiltrative parenchymal disease.  There is also a nodular appearance in the anterior left lobe which could suggest cirrhotic component.  The scan also showed mildly enlarged retroperitoneal and inguinopelvic nodes and a left mid abdominal wall subcutaneous hypodensity measuring 2.8 x 1.5 cm.  CT chest has been ordered but not yet performed.  Additional studies are also pending including QuantiFERON, viral hepatitis studies, histoplasma, Blastomyces, RPR, AFB, fungal culture.  Of note, the patient's HIV antibody test was reactive and additional studies are pending including HIV-1/2 AB as well as a HIV RNA quant.  ? ?I spoke with the patient and her  husband today.  The patient reports that overall she is feeling well.  However, she does report back pain.  She thinks her back hurts due to the hospital bed.  She reports that she still has some dizziness when ambulating.  She reports that her appetite has been good and she has not lost any significant weight recently.  She is not having any headaches, fevers, chills, night sweats.  Denies chest pain, shortness of breath, abdominal pain, nausea, vomiting, constipation, diarrhea.  No bleeding reported.  The patient is married and has 6 children.  She is originally from [place].  Denies history of alcohol and tobacco use.  Family history negative for blood disorders and malignancy.  Medical oncology was asked to the patient to make recommendations regarding her abnormal imaging findings and hypercalcemia. ? ?Past Medical History:  ?Diagnosis Date  ? Type 2 diabetes mellitus (HCC)   ?: ? ?Past Surgical History:  ?Procedure Laterality Date  ? CESAREAN SECTION  03/05/2012  ? Procedure: CESAREAN SECTION;  Surgeon: Avel Sensor, MD;  Location: [facility];  Service: Obstetrics;  Laterality: N/A;  ?: ? ?Current Facility-Administered Medications  ?Medication Dose Route Frequency Provider Last Rate Last Admin  ? 0.9 %  sodium chloride infusion   Intravenous Continuous [name], MD 125 mL/hr at 10/03/21 0513 New Bag at 10/03/21 0513  ? acetaminophen (TYLENOL) tablet 650 mg  650 mg Oral Q6H PRN Etta Quill, DO   650 mg at 10/02/21 1804  ? Or  ? acetaminophen (TYLENOL) suppository 650 mg  650 mg Rectal Q6H PRN Etta Quill, DO      ? bisacodyl (DULCOLAX) suppository 10 mg  10 mg Rectal Once [name], MD      ? docusate  sodium (COLACE) capsule 100 mg  100 mg Oral BID [name], MD   100 mg at 10/02/21 2128  ? enoxaparin (LOVENOX) injection 40 mg  40 mg Subcutaneous QHS Jennette Kettle M, DO   40 mg at 10/02/21 2129  ? gabapentin (NEURONTIN) capsule 100 mg  100 mg Oral QHS Jennette Kettle M, DO   100 mg  at 09/30/21 2229  ? insulin aspart (novoLOG) injection 0-20 Units  0-20 Units Subcutaneous TID WC Etta Quill, DO   7 Units at 10/03/21 0849  ? insulin aspart (novoLOG) injection 6 Units  6 Units Subcutaneous TID WC Etta Quill, DO   6 Units at 10/03/21 0848  ? insulin glargine-yfgn (SEMGLEE) injection 50 Units  50 Units Subcutaneous Daily Etta Quill, DO   50 Units at 10/02/21 1121  ? ondansetron (ZOFRAN) tablet 4 mg  4 mg Oral Q6H PRN Etta Quill, DO      ? Or  ? ondansetron (ZOFRAN) injection 4 mg  4 mg Intravenous Q6H PRN Etta Quill, DO   4 mg at 10/02/21 [id number]  ? pantoprazole (PROTONIX) EC tablet 40 mg  40 mg Oral BID AC Pokhrel, Laxman, MD   40 mg at 10/03/21 0848  ? polyethylene glycol (MIRALAX / GLYCOLAX) packet 17 g  17 g Oral Daily [name], MD   17 g at 10/02/21 1239  ? sodium chloride (PF) 0.9 % injection           ? ? ?No Known Allergies: ? ?History reviewed. No pertinent family history.: ? ?Social History  ? ?Socioeconomic History  ? Marital status: Married  ?  Spouse name: Not on file  ? Number of children: Not on file  ? Years of education: Not on file  ? Highest education level: Not on file  ?Occupational History  ? Not on file  ?Tobacco Use  ? Smoking status: Never  ? Smokeless tobacco: Not on file  ?Substance and Sexual Activity  ? Alcohol use: No  ? Drug use: No  ? Sexual activity: Yes  ?Other Topics Concern  ? Not on file  ?Social History Narrative  ? Not on file  ? ?Social Determinants of Health  ? ?Financial Resource Strain: Not on file  ?Food Insecurity: Not on file  ?Transportation Needs: Not on file  ?Physical Activity: Not on file  ?Stress: Not on file  ?Social Connections: Not on file  ?Intimate Partner Violence: Not on file  ?: ? ?Review of Systems: A comprehensive 14 point review of systems was negative except as noted in the HPI. ? ?Exam: ?Patient Vitals for the past 24 hrs: ? BP Temp Temp src Pulse Resp SpO2  ?10/03/21 0544 (!) 150/97 98.3 ?F (36.8 ?C)  Oral 68 18 99 %  ?10/02/21 1920 (!) 158/80 98.2 ?F (36.8 ?C) Oral 68 18 100 %  ?10/02/21 1252 (!) 151/73 98.1 ?F (36.7 ?C) Oral 66 18 100 %  ? ? ?General: Awake and alert, no distress. ?Eyes:  no scleral icterus.   ?ENT:  There were no oropharyngeal lesions.     ?Lymphatics: No cervical or axillary lymphadenopathy.  Has shotty lymphadenopathy in the left clavicular area. ?Respiratory: lungs were clear bilaterally without wheezing or crackles.   ?Cardiovascular:  Regular rate and rhythm, S1/S2, without murmur, rub or gallop.  There was no pedal edema.   ?GI: Positive bowel sounds, soft, nontender.  ?Skin exam was without echymosis, petichae.   ?Neuro exam was nonfocal. Patient was  alert and oriented.  Attention was good.   Language was appropriate.  Mood was normal without depression.  Speech was not pressured.  Thought content was not tangential.   ? ? ?Lab Results  ?Component Value Date  ? WBC 6.9 10/02/2021  ? HGB 9.9 (L) 10/02/2021  ? HCT 30.5 (L) 10/02/2021  ? PLT 250 10/02/2021  ? GLUCOSE 201 (H) 10/03/2021  ? CHOL 167 08/22/2021  ? TRIG 131.0 08/22/2021  ? HDL 38.50 (L) 08/22/2021  ? LDLCALC 102 (H) 08/22/2021  ? ALT 18 10/01/2021  ? AST 17 10/01/2021  ? NA 138 10/03/2021  ? K 3.6 10/03/2021  ? CL 108 10/03/2021  ? CREATININE 0.84 10/03/2021  ? BUN 14 10/03/2021  ? CO2 25 10/03/2021  ? ? ?DG Chest 2 View ? ?Result Date: 09/30/2021 ?CLINICAL DATA:  Nausea, fatigue EXAM: CHEST - 2 VIEW COMPARISON:  None. FINDINGS: Normal mediastinum and cardiac silhouette. Normal pulmonary vasculature. No evidence of effusion, infiltrate, or pneumothorax. No acute bony abnormality. IMPRESSION: No acute cardiopulmonary process. Electronically Signed   By: Suzy Bouchard M.D.   On: 09/30/2021 15:31  ? ?CT Head Wo Contrast ? ?Result Date: 09/30/2021 ?CLINICAL DATA:  Headache.  Fatigue. EXAM: CT HEAD WITHOUT CONTRAST TECHNIQUE: Contiguous axial images were obtained from the base of the skull through the vertex without intravenous  contrast. RADIATION DOSE REDUCTION: This exam was performed according to the departmental dose-optimization program which includes automated exposure control, adjustment of the mA and/or kV according to pat

## 2021-10-03 NOTE — Progress Notes (Addendum)
Inpatient Diabetes Program Recommendations ? ?AACE/ADA: New Consensus Statement on Inpatient Glycemic Control (2015) ? ?Target Ranges:  Prepandial:   less than 140 mg/dL ?     Peak postprandial:   less than 180 mg/dL (1-2 hours) ?     Critically ill patients:  140 - 180 mg/dL  ? ?Lab Results  ?Component Value Date  ? GLUCAP 206 (H) 10/02/2021  ? HGBA1C 8.2 (A) 08/22/2021  ? ? ?Review of Glycemic Control ? ?Diabetes history: DM1 ?Outpatient Diabetes medications: Toujeo 46 QD, Novolog s/s (max 100 units) ?Current orders for Inpatient glycemic control: Semglee 50 QD, Novolog 0-20 TID with meals + 6 units TID ? ?HgbA1C - 8.2% ? ?Inpatient Diabetes Program Recommendations:   ? ?Increase Semglee to 55 units QD ?Increase Novolog to 10 units TID for meal coverage if eating > 50%. ? ?Will speak with pt about her diabetes control and HgbA1C of 8.2% today. ? ?Follow. ? ?Thank you. ?Lorenda Peck, RD, LDN, CDE ?Inpatient Diabetes Coordinator ?[phone]  ? ?Addendum: Spoke with pt about her diabetes control at home. Discussed HgbA1C of 8.2%. Stressed importance of f/u with PCP. Pt states she has hypoglycemia occasionally at night. Drinks juice and rechecks blood sugar. May need less Toujeo if hypos are frequent. Encouraged healthy eating and continue to monitor blood sugars.  ?RV ? ? ? ? ? ? ? ? ?

## 2021-10-03 NOTE — Progress Notes (Signed)
Initial Nutrition Assessment ? ?INTERVENTION:  ? ?-Ensure MAX Protein po BID, each supplement provides 150 kcal and 30 grams of protein (Halal and Kosher) ? ?-Multivitamin with minerals daily ? ?-Check anemia panel (iron, B-12 and folate) ? ?NUTRITION DIAGNOSIS:  ? ?Inadequate oral intake related to lethargy/confusion as evidenced by per patient/family report. ? ?GOAL:  ? ?Patient will meet greater than or equal to 90% of their needs ? ?MONITOR:  ? ?PO intake, Supplement acceptance, Labs, Weight trends, I & O's ? ?REASON FOR ASSESSMENT:  ? ?Consult ?Assessment of nutrition requirement/status ? ?ASSESSMENT:  ? ?51 y.o. female with past medical history of diabetes presented to the hospital with complaints of fatigue dizziness generalized weakness nausea epigastric discomfort.  Patient was taking vitamin D at home and was taking weekly dose more frequently.  In the ED patient was noted to be hypercalcemic.  Received IV fluid and Lasix and was admitted hospital for symptomatic hypercalcemia likely from vitamin D intoxication ? ?Patient in room, trying to order lunch consisting of a veggie burger. Pt asking if we had a diabetes diet menu. Pt reports not eating meat in the hospital as she primarily eats halal or kosher at home. Does not take any other vitamin or mineral supplements. ? ?Pt states she was having hair loss and went to the doctor in February 2023, she was found to have low Vitamin D levels at that time. She was prescribed supplementation but continued to take Vit D weekly and daily. It was not communicated to her when to stop. Pt has had increased confusion, poor memory, weakness in her BLEs. RD noted some blood around her nail beds. Would check anemia panel given suspected poor intakes, somewhat restricted diet and symptoms reported. Discussed with patient. Last iron/B-12 checked in December 2022. ? ?Currently pt has been consuming 95-100% of meals.  ? ?Per weight records, no weight loss noted. States UBW  ~120 lbs. ? ?Medications: Colace, Miralax ? ?Labs reviewed: ?CBGs: 146-266  ?Low K ?Ca: 12.1 ?Low Mg ? ?NUTRITION - FOCUSED PHYSICAL EXAM: ? ?Flowsheet Row Most Recent Value  ?Orbital Region No depletion  ?Upper Arm Region No depletion  ?Thoracic and Lumbar Region No depletion  ?Buccal Region No depletion  ?Temple Region No depletion  ?Clavicle Bone Region No depletion  ?Clavicle and Acromion Bone Region No depletion  ?Scapular Bone Region No depletion  ?Dorsal Hand Mild depletion  ?Patellar Region Mild depletion  ?Anterior Thigh Region Mild depletion  ?Posterior Calf Region Mild depletion  ?Edema (RD Assessment) None  ?Hair Reviewed  [reports hair loss]  ?Eyes Reviewed  ?Mouth Reviewed  [dry lips and mouth]  ?Skin Reviewed  ?Nails Reviewed  [blood around nail beds]  ? ?  ? ? ?Diet Order:   ?Diet Order   ? ?       ?  Diet Carb Modified Fluid consistency: Thin; Room service appropriate? Yes  Diet effective now       ?  ? ?  ?  ? ?  ? ? ?EDUCATION NEEDS:  ? ?Education needs have been addressed ? ?Skin:  Skin Assessment: Reviewed RN Assessment ? ?Last BM:  4/5 ? ?Height:  ? ?Ht Readings from Last 1 Encounters:  ?09/30/21 5' (1.524 m)  ? ? ?Weight:  ? ?Wt Readings from Last 1 Encounters:  ?09/30/21 58.9 kg  ? ?BMI:  Body mass index is 25.36 kg/m?. ? ?Estimated Nutritional Needs:  ? ?Kcal:  1550-1750 ? ?Protein:  65-80g ? ?Fluid:  1.7L/day ? ?Tilda Franco,  MS, RD, LDN ?Inpatient Clinical Dietitian ?Contact information available via Amion ? ?

## 2021-10-03 NOTE — Discharge Planning (Signed)
Oncology Discharge Planning Admission Note ? ?[facility] ?Address: [address] [place], [place], [place] [address] ?Hours of Operation:  8am - 5pm, Monday - Friday  ?Clinic Contact Information:  [phone]) [phone] ? ?Oncology Care Team: ?Medical Oncologist:   ? ?[name] is aware of this hospital admission dated 10/02/21. The cancer center will follow [name]?s inpatient care to assist with discharge planning as indicated by the oncologist.  We will arrange any necessary outpatient follow up prior to discharge, if feasible. ? ?Disclaimer:  This Cancer Center nursing note does not imply a formal consult request has been made by the admitting attending for this admission or there will be an inpatient consult completed by oncology.  Please request oncology consults as per standard process as indicated. ?

## 2021-10-03 NOTE — Progress Notes (Signed)
?  Progress Note ? ? ?PatientAngy Wade [id number] DOB: 1971/06/09 DOA: 09/30/2021     1 ?DOS: the patient was seen and examined on 10/03/2021 ?  ?Brief hospital course: ?51 y.o. female with past medical history of diabetes presented to the hospital with complaints of fatigue dizziness generalized weakness nausea epigastric discomfort.  Patient was taking vitamin D at home and was taking weekly dose more frequently.  In the ED patient was noted to be hypercalcemic.  Received IV fluid and Lasix and was admitted hospital for symptomatic hypercalcemia likely from vitamin D intoxication ? ?Assessment and Plan: ?* Hypercalcemia ?Vit D toxicity is top of differential.  On both Daily and weekly Vit D for past month, see HPI. ?Vit d level is borderline elevated ?PTH low ?NS ?Got 1 dose of lasix in ED ?Ca improving with IVF hydration ? ?Liver lesion ?-CT abd/pelvis reviewed. Innumerable liver lesions and splenic lesions seen. Multiple enlarged nodes also noted. Differentials include infectious vs malignant etiology ?-ID following, had also consulted Oncology, recs appreciated ?-CT chest ordered and reviewed. No acute process identified ? 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 ?Type 1 diabetes mellitus with diabetic polyneuropathy (HCC) ?Takes 50u daily of glargine in AM + 20-25 mealtime novolog TID at home ?Currently on 55u daily of glargine ?10u mealtime novolog ?Cont with SSI TID as needed while in hospital ? ? ? ?  ? ?Subjective: Reports feeling somewhat better. Small hard stool this AM. Attempting to drink miralax when seen ? ?Physical Exam: ?Vitals:  ? 10/02/21 1252 10/02/21 1920 10/03/21 0544 10/03/21 1427  ?BP: (!) 151/73 (!) 158/80 (!) 150/97 (!) 145/69  ?Pulse: 66 68 68 70  ?Resp: 18 18 18 18   ?Temp: 98.1 ?F (36.7 ?C) 98.2 ?F (36.8 ?C) 98.3 ?F (36.8 ?C) 99 ?F (37.2 ?C)  ?TempSrc: Oral Oral Oral Oral  ?SpO2: 100% 100% 99% 100%  ?Weight:       ?Height:      ? ?General exam: Awake, laying in bed, in nad ?Respiratory system: Normal respiratory effort, no wheezing ?Cardiovascular system: regular rate, s1, s2 ?Gastrointestinal system: Soft, nondistended, positive BS ?Central nervous system: CN2-12 grossly intact, strength intact ?Extremities: Perfused, no clubbing ?Skin: Normal skin turgor, no notable skin lesions seen ?Psychiatry: Mood normal // no visual hallucinations  ? ?Data Reviewed: ? ?Labs reviewed, Ca 11.7, PTH 4 ? ?Family Communication: Pt in room, family not at bedside ? ?Disposition: ?Status is: Inpatient ?Continue inpatient status because: Severity of illness ? Planned Discharge Destination: Home ? ? ? ?Author: ?Marylu Lund, MD ?10/03/2021 3:39 PM ? ?For on call review [email or web address].  ?

## 2021-10-03 NOTE — Assessment & Plan Note (Addendum)
-  CT abd/pelvis reviewed. Innumerable liver lesions and splenic lesions seen. Multiple enlarged nodes also noted. Differentials include infectious vs malignant etiology ?-CT chest ordered and reviewed. No acute process identified ?-Have ordered liver biopsy per ID recs, pathology pending ?-Liver culture neg ?-AFB studies pending ?

## 2021-10-03 NOTE — Consult Note (Signed)
? ? ?[facility]  ?                                                                                     ? ?Patient Identification: ?Patient Name: [name] MRN: [id number] Admit Date: 09/30/2021 12:47 PM ?Today's Date: 10/03/2021 ?Reason for consult: HIV prelim +, abnormal CT abdomen/pelvis findings  ?Requesting provider: Rickey BarbaraStephen Chiu ? ?Principal Problem: ?  Hypercalcemia ?Active Problems: ?  Type 1 diabetes mellitus with diabetic polyneuropathy (HCC) ?  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 ? ?Antibiotics:  ?None ? ?Lines/Hardware: None  ? ?Assessment ?51 year old female with originally from [place] with type I DM who initially presented with worsening abdominal pain, generalized weakness/fatigue, N/V, loss of appetite/energy and dizziness. Found to have hypercalcemia in the setting of VitD intake. ID consulted in the setting of HIV prelim test positive and abnormal CT abd/pelvis findings. ? ?Recommendations  ?-I will order CT chest w contrast to look for any axillary, mediastinal or hilar lymphadnopathy ?-Oncology consult given possible malignant causes of diffuse hepatic and splenic lesions with hypercalcemia ?-Fu HIV RNA, fu HIV differentiation assay. It is possible that HIV prelim could be false positive given no significant risk factors for HIV and h/o autoimmune conditions  ?-Fungal and AFB blood cultures  ?-Blastomyces ag/Histoplasma ag urine  ?-Quantiferon, Hepatitis B and C serology ?-Would recommend surgical vs IR guided biopsy of the hepatic lesions after Oncology input for definitive diagnosis given possible both infectious and non infectious causes including malignancy  ? ?Dr Luciana Axeomer will follow from tomorrow  ? ?Rest of the management as per the primary team. Please call with questions or concerns.  ?Thank you for the consult ? ?Odette FractionSabina Keysha Damewood, MD ?Infectious Disease Physician ?[facility]  [facility] ?[address] ?[place], [place] [address] ?Phone: [phone]  Fax: [phone] ? ?__________________________________________________________________________________________________________ ?HPI and Hospital Course: ?51 year old female with PMH of type I DM who presented to the ED from UC on 4/3where she initially presented for generalized weakness/fatigue, N/V, loss of appetite/energy and dizziness since Wednesday. Urine BG >1000in the UC ? ?At ED, she was afebrile, no leukocytosis, however calcium was elevated to 13 and was admitted for management of hypercalcemia.  HIV prelim is positive, differentiation is in process, HIV RNA is pending.  CT abdomen/pelvis was done for abdominal pain with significant findings as below.  ? ?She does understand and speaks English somewhat but more comfortable in Urdu. She tells me she had ongoing lower abdominal pain for last 4-5 months which has been getting somewhat worse in the last couple of months including decreased energy/fatigue. She would get tired standing very easily while cooking. She would  also get tired after reading " [employer]" during Ramadan. Also had pressure like sensation in the chest. Denies fevers but complains of chills. Denies sweats. Appetite is poor and feels nauseous with occasional episodes pf vomiting in the morning. She lost a significant amount of weight when diagnosed with DM in 2010, unsure about her weight now.  ?Minimal cough if any, has exertional SOB. She was also recently seen by a doctor for hair fall and was started on  Vitamin D supplements( was taking once daily as well as weekly dose as she did not know to stop the daily regimen).  ? ?Born in [place] and came to [place] after marriage in 2000 with her husband. She initially stayed in [facility] and currently living in [place] with her husband. She has 6 children, oldest is 29 years old daughter ( married) and youngest is 48 years old. Denies any sexual partners except her husband.  She has also told her husband to get checked for HIV. She is kind of suspicious about her husband possibly having an extra-marital relation and says she had seen picture of a female in their  room+multiple phone calls from a female in the past. She tells me her son in law is a resident doctor at [employer] and gave me his cell phone to talk to him about mty evaluation.  ? ?Diagnosed with Type 1 DM in 2010 and has been since on Insulin ?Says she had a skin condition ( ?eczema ) in her bilateral legs at the age of 73 and has used different creams but seemed to help with the injections ( unknown name) ? ?Denies any pets at home. Denies travel outside of [place] except to [place]. Last travel in 2018 and was not sick at that time.  Denies any smoking,, alcohol and IVDU.  Denies any contact with tuberculosis.  Denies any family history of tuberculosis.  Denies being incarcerated, or homeless or lives in an congregate setting. ? ?I spoke with patient's son in law [place] by patient's permission regarding my assessment and further plan. He would like to be updated moving forward.  ? ?ROS: all systems reviewed and negative except as stated above  ? ?Past Medical History:  ?Diagnosis Date  ? Type 2 diabetes mellitus (HCC)   ? ?Past Surgical History:  ?Procedure Laterality Date  ? CESAREAN SECTION  03/05/2012  ? Procedure: CESAREAN SECTION;  Surgeon: Fortino Sic, MD;  Location: [facility];  Service: Obstetrics;  Laterality: N/A;  ? ? ? ?Scheduled Meds: ? bisacodyl  10 mg Rectal Once  ? docusate sodium  100 mg Oral BID  ? enoxaparin (LOVENOX) injection  40 mg Subcutaneous QHS  ? gabapentin  100 mg Oral QHS  ? insulin aspart  0-20 Units Subcutaneous TID WC  ? insulin aspart  6 Units Subcutaneous TID WC  ? insulin glargine-yfgn  50 Units Subcutaneous Daily  ? pantoprazole  40 mg Oral BID AC  ? polyethylene glycol  17 g Oral Daily  ? sodium chloride (PF)      ? ?Continuous Infusions: ? sodium chloride 125 mL/hr at 10/03/21 0513  ? ?PRN  Meds:.acetaminophen **OR** acetaminophen, ondansetron **OR** ondansetron (ZOFRAN) IV ? ?No Known Allergies ? ?Social History  ? ?Socioeconomic History  ? Marital status: Married  ?  Spouse name: Not on file  ? Number of children: Not on file  ? Years of education: Not on file  ? Highest education level: Not on file  ?Occupational History  ? Not on file  ?Tobacco Use  ? Smoking status: Never  ? Smokeless tobacco: Not on file  ?Substance and Sexual Activity  ? Alcohol use: No  ? Drug use: No  ? Sexual activity: Yes  ?Other Topics Concern  ? Not on file  ?Social History Narrative  ? Not on file  ? ?Social Determinants of Health  ? ?Financial Resource Strain: Not on file  ?Food Insecurity: Not on file  ?Transportation Needs: Not on file  ?Physical  Activity: Not on file  ?Stress: Not on file  ?Social Connections: Not on file  ?Intimate Partner Violence: Not on file  ? ?Breast Cancer-relatedfamily history is not on file. ? ?Vitals ?BP (!) 150/97 (BP Location: Right Arm)   Pulse 68   Temp 98.3 ?F (36.8 ?C) (Oral)   Resp 18   Ht 5' (1.524 m)   Wt 58.9 kg   SpO2 99%   BMI 25.36 kg/m?  ? ?Physical Exam ?Constitutional:  looks tired and fatigued. Not in acute distress ?   Comments: No cervical and axillary lymphadenopathy  ? ?Cardiovascular:  ?   Rate and Rhythm: Normal rate and regular rhythm.  ?   Heart sounds:  ? ?Pulmonary:  ?   Effort: Pulmonary effort is normal.  ?   Comments: Bilateral clear air entry ? ?Abdominal:  ?   Palpations: Abdomen is soft.  ?   Tenderness: non tender and non distended  ? ?Musculoskeletal:     ?   General: No swelling or tenderness.  ? ?Skin: ?   Comments:  ? ? ? ? ? ? ?Neurological:  ?   General: grossly non focal, awake, alert and oriented  ? ?Psychiatric:     ?   Mood and Affect: Mood normal.  ? ? ?Pertinent Microbiology ?Results for orders placed or performed during the hospital encounter of 09/30/21  ?Resp Panel by RT-PCR (Flu A&B, Covid) Nasopharyngeal Swab     Status: None  ?  Collection Time: 09/30/21  6:39 PM  ? Specimen: Nasopharyngeal Swab; Nasopharyngeal(NP) swabs in vial transport medium  ?Result Value Ref Range Status  ? SARS Coronavirus 2 by RT PCR NEGATIVE NEGATIVE Final  ?  Comment

## 2021-10-03 NOTE — Plan of Care (Signed)

## 2021-10-04 ENCOUNTER — Inpatient Hospital Stay (HOSPITAL_COMMUNITY): Payer: 59

## 2021-10-04 DIAGNOSIS — R531 Weakness: Secondary | ICD-10-CM | POA: Diagnosis not present

## 2021-10-04 DIAGNOSIS — K769 Liver disease, unspecified: Secondary | ICD-10-CM | POA: Diagnosis not present

## 2021-10-04 LAB — CBC
HCT: 30.8 % — ABNORMAL LOW (ref 36.0–46.0)
Hemoglobin: 10 g/dL — ABNORMAL LOW (ref 12.0–15.0)
MCH: 27 pg (ref 26.0–34.0)
MCHC: 32.5 g/dL (ref 30.0–36.0)
MCV: 83.2 fL (ref 80.0–100.0)
Platelets: 262 10*3/uL (ref 150–400)
RBC: 3.7 MIL/uL — ABNORMAL LOW (ref 3.87–5.11)
RDW: 13.7 % (ref 11.5–15.5)
WBC: 6.5 10*3/uL (ref 4.0–10.5)
nRBC: 0 % (ref 0.0–0.2)

## 2021-10-04 LAB — COMPREHENSIVE METABOLIC PANEL
ALT: 32 U/L (ref 0–44)
AST: 28 U/L (ref 15–41)
Albumin: 3.2 g/dL — ABNORMAL LOW (ref 3.5–5.0)
Alkaline Phosphatase: 79 U/L (ref 38–126)
Anion gap: 7 (ref 5–15)
BUN: 13 mg/dL (ref 6–20)
CO2: 28 mmol/L (ref 22–32)
Calcium: 11.8 mg/dL — ABNORMAL HIGH (ref 8.9–10.3)
Chloride: 103 mmol/L (ref 98–111)
Creatinine, Ser: 0.83 mg/dL (ref 0.44–1.00)
GFR, Estimated: >60 mL/min (ref >60)
Glucose, Bld: 202 mg/dL — ABNORMAL HIGH (ref 70–99)
Potassium: 3.3 mmol/L — ABNORMAL LOW (ref 3.5–5.1)
Sodium: 138 mmol/L (ref 135–145)
Total Bilirubin: 0.3 mg/dL (ref 0.3–1.2)
Total Protein: 7.7 g/dL (ref 6.5–8.1)

## 2021-10-04 LAB — FERRITIN: Ferritin: 41 ng/mL (ref 11–307)

## 2021-10-04 LAB — GLUCOSE, CAPILLARY
Glucose-Capillary: 269 mg/dL — ABNORMAL HIGH (ref 70–99)
Glucose-Capillary: 142 mg/dL — ABNORMAL HIGH (ref 70–99)
Glucose-Capillary: 324 mg/dL — ABNORMAL HIGH (ref 70–99)
Glucose-Capillary: 261 mg/dL — ABNORMAL HIGH (ref 70–99)

## 2021-10-04 LAB — IRON AND TIBC
Iron: 40 ug/dL (ref 28–170)
Saturation Ratios: 12 % (ref 10.4–31.8)
TIBC: 339 ug/dL (ref 250–450)
UIBC: 299 ug/dL

## 2021-10-04 LAB — RETICULOCYTES
Immature Retic Fract: 11.5 % (ref 2.3–15.9)
RBC.: 3.69 MIL/uL — ABNORMAL LOW (ref 3.87–5.11)
Retic Count, Absolute: 55 10*3/uL (ref 19.0–186.0)
Retic Ct Pct: 1.5 % (ref 0.4–3.1)

## 2021-10-04 LAB — FOLATE: Folate: 11.5 ng/mL (ref >5.9)

## 2021-10-04 LAB — C-REACTIVE PROTEIN: CRP: 1 mg/dL — ABNORMAL HIGH (ref <1.0)

## 2021-10-04 LAB — URIC ACID: Uric Acid, Serum: 4.4 mg/dL (ref 2.5–7.1)

## 2021-10-04 LAB — HEPATITIS C ANTIBODY: HCV Ab: NONREACTIVE

## 2021-10-04 LAB — SEDIMENTATION RATE: Sed Rate: 95 mm/hr — ABNORMAL HIGH (ref 0–22)

## 2021-10-04 LAB — PROTIME-INR
INR: 0.9 (ref 0.8–1.2)
Prothrombin Time: 12 seconds (ref 11.4–15.2)

## 2021-10-04 LAB — RPR: RPR Ser Ql: NONREACTIVE

## 2021-10-04 LAB — HEPATITIS B SURFACE ANTIBODY, QUANTITATIVE: Hep B S AB Quant (Post): <3.1 m[IU]/mL — ABNORMAL LOW (ref >9.9)

## 2021-10-04 LAB — LACTATE DEHYDROGENASE: LDH: 112 U/L (ref 98–192)

## 2021-10-04 LAB — HEPATITIS B SURFACE ANTIGEN: Hepatitis B Surface Ag: NONREACTIVE

## 2021-10-04 LAB — VITAMIN B12: Vitamin B-12: 420 pg/mL (ref 180–914)

## 2021-10-04 MED ORDER — INSULIN GLARGINE-YFGN 100 UNIT/ML ~~LOC~~ SOLN
60.0000 [IU] | Freq: Every day | SUBCUTANEOUS | Status: DC
  Filled 2021-10-04: qty 0.6

## 2021-10-04 MED ORDER — DEXTROSE 50 % IV SOLN
INTRAVENOUS | Status: AC | PRN
  Administered 2021-10-04: 50 mL via INTRAVENOUS

## 2021-10-04 MED ORDER — LABETALOL HCL 5 MG/ML IV SOLN
10.0000 mg | INTRAVENOUS | Status: DC | PRN
  Administered 2021-10-04: 10 mg via INTRAVENOUS
  Filled 2021-10-04 (×2): qty 4

## 2021-10-04 MED ORDER — MIDAZOLAM HCL 2 MG/2ML IJ SOLN
INTRAMUSCULAR | Status: AC | PRN
Start: 2021-10-04 — End: 2021-10-04
  Administered 2021-10-04: 1 mg via INTRAVENOUS

## 2021-10-04 MED ORDER — FENTANYL CITRATE (PF) 100 MCG/2ML IJ SOLN
INTRAMUSCULAR | Status: AC | PRN
  Administered 2021-10-04: 50 ug via INTRAVENOUS

## 2021-10-04 MED ORDER — SODIUM CHLORIDE 0.9 % IV SOLN
INTRAVENOUS | Status: AC
Start: 2021-10-04 — End: 2021-10-05

## 2021-10-04 MED ORDER — GELATIN ABSORBABLE 12-7 MM EX MISC
CUTANEOUS | Status: AC
  Administered 2021-10-04: 1
  Filled 2021-10-04: qty 1

## 2021-10-04 MED ORDER — MIDAZOLAM HCL 2 MG/2ML IJ SOLN
INTRAMUSCULAR | Status: AC
  Filled 2021-10-04: qty 2

## 2021-10-04 MED ORDER — DEXTROSE 50 % IV SOLN
INTRAVENOUS | Status: AC
  Filled 2021-10-04: qty 50

## 2021-10-04 MED ORDER — FENTANYL CITRATE (PF) 100 MCG/2ML IJ SOLN
INTRAMUSCULAR | Status: AC
  Filled 2021-10-04: qty 2

## 2021-10-04 MED ORDER — LIDOCAINE HCL 1 % IJ SOLN
INTRAMUSCULAR | Status: AC
  Administered 2021-10-04: 15 mL
  Filled 2021-10-04: qty 20

## 2021-10-04 MED ORDER — DEXTROSE 50 % IV SOLN
25.0000 g | INTRAVENOUS | Status: AC
  Administered 2021-10-04: 25 g via INTRAVENOUS

## 2021-10-04 NOTE — Plan of Care (Signed)

## 2021-10-04 NOTE — Sedation Documentation (Signed)
Repeat CBG 189. Dr Elby Showers aware. ?

## 2021-10-04 NOTE — Procedures (Signed)
Interventional Radiology Procedure Note ? ?Procedure: Ultrasound guided liver biopsy ? ?Findings: Please refer to procedural dictation for full description. Heterogenous sonographic appearance of liver.  Total of 4 18 ga cores obtained from segment VI, divided into formalin and saline.  Gelfoam needle track embolization. ? ?Complications: None immediate ? ?Estimated Blood Loss: < 5 mL ? ?Recommendations: ?Follow up Pathology/cultures. ?Strict 3 hour bedrest. ? ? ?Ruthann Cancer, MD ?Pager: [phone] ? ? ?

## 2021-10-04 NOTE — Sedation Documentation (Signed)
CBG 40. PA Allred notified. 25 Gm 50% Dextrose given IV x1.  ?

## 2021-10-04 NOTE — Consult Note (Signed)
? ?Chief Complaint: ?Patient was seen in consultation today for image guided liver biopsy ?Chief Complaint  ?Patient presents with  ? Dizziness  ? ? ?Referring Physician(s): ?Chiu,S ? ?Supervising Physician: Ruthann Cancer ? ?Patient Status: [facility] - In-pt ? ?History of Present Illness: ?[name] is a 51 y.o. female with history of diabetes who was admitted to [facility] on 4/3 with complaints of fatigue, dizziness, generalized weakness, nausea, epigastric discomfort.  In the ED patient was noted to be hypercalcemic and received IV fluid and Lasix, ? vitamin D intoxication.  CT abdomen/pelvis 4/5  revealed: ?1. Innumerable hypodense lesions replacing the splenic parenchyma, ?with mild splenomegaly. Density is above simple fluid and in the ?solid range versus very viscous fluid, solid lesions favored. ?Differential diagnosis would mainly be between infectious process ?including fungal abscesses, metastases, granulomatous infections and ?lymphoma. ?2. Innumerable tiny hypodensities throughout the hepatic parenchyma ?compatible with diffuse parenchymal disease whether chronic ?infectious such as due to sclerosing cholangitis, or diffuse ?infiltrative parenchymal disease. There is slight nodular appearance ?of the anterior left lobe which may suggest a cirrhotic component, ?with the portal vein is not dilated. ?3. Mildly enlarged retroperitoneal and inguinopelvic lymph nodes. No ?bulky or encasing adenopathy. ?4. Left mid abdominal wall subcutaneous 2.8 x 1.5 cm hypodensity ?which could be injected material or phlegmon with evidence of recent ?injection in the subcutaneous plane lateral to this. ?5. Aortic atherosclerosis. ?6. Pelvic venous congestion, mild. ?7. Constipation and diverticulosis. ?8. L5-S1 left paracentral herniated disc with mass effect on the ?left S1 nerve root. ? ?CT chest yesterday revealed : ?1.  No CT evidence of acute cardiopulmonary process. ?  ?2.  No bulky axillary, mediastinal or  hilar lymphadenopathy. ?  ?3. Subcentimeter bilateral axillary and a single right paratracheal ?lymph node, likely reactive. ?  ?4. Enlarged spleen with multiple hypodense lesions, which may be ?secondary to infectious/inflammatory process or lymphoma. Clinical ?correlation is suggested ? ?Patient was noted to be HIV positive on initial screen however confirmatory testing for both HIV 1 and HIV 2 were negative; request now received for image guided liver biopsy for further evaluation. ? ?Past Medical History:  ?Diagnosis Date  ? Type 2 diabetes mellitus ([place])   ? ? ?Past Surgical History:  ?Procedure Laterality Date  ? CESAREAN SECTION  03/05/2012  ? Procedure: CESAREAN SECTION;  Surgeon: Avel Sensor, MD;  Location: [facility];  Service: Obstetrics;  Laterality: N/A;  ? ? ?Allergies: ?Patient has no known allergies. ? ?Medications: ?Prior to Admission medications   ?Medication Sig Start Date End Date Taking? Authorizing Provider  ?Continuous Blood Gluc Receiver (FREESTYLE LIBRE 2 READER) DEVI 1 Device by Does not apply route as directed. 08/22/21  Yes Shamleffer, Melanie Crazier, MD  ?Continuous Blood Gluc Sensor (FREESTYLE LIBRE 2 SENSOR) MISC 1 Device by Does not apply route every 14 (fourteen) days. 08/22/21  Yes Shamleffer, Melanie Crazier, MD  ?Ferrous Sulfate (IRON PO) Take 1 tablet by mouth daily.   Yes [provider]  ?gabapentin (NEURONTIN) 100 MG capsule Take 1 capsule (100 mg total) by mouth at bedtime. 08/26/21  Yes Shamleffer, Melanie Crazier, MD  ?glucose blood (CONTOUR NEXT TEST) test strip 4 (four) times daily. 06/05/20  Yes [provider]  ?insulin aspart (NOVOLOG FLEXPEN) 100 UNIT/ML FlexPen Max daily 100 units ?Patient taking differently: Max daily 100 units; sliding scale 08/22/21  Yes Shamleffer, Melanie Crazier, MD  ?Insulin Pen Needle 32G X 4 MM MISC 1 Device by Does not apply route in the morning,  at noon, in the evening, and at bedtime. 08/22/21  Yes Shamleffer, Melanie Crazier,  MD  ?lisinopril (ZESTRIL) 20 MG tablet Take 1 tablet (20 mg total) by mouth daily. 08/26/21  Yes Shamleffer, Melanie Crazier, MD  ?Nelva Nay SOLOSTAR 300 UNIT/ML Solostar Pen Inject 46 Units into the skin daily. ?Patient taking differently: Inject 60 Units into the skin daily. 08/22/21  Yes Shamleffer, Melanie Crazier, MD  ?VITAMIN D PO Take 1 tablet by mouth daily.   Yes [provider]  ?  ? ?History reviewed. No pertinent family history. ? ?Social History  ? ?Socioeconomic History  ? Marital status: Married  ?  Spouse name: Not on file  ? Number of children: Not on file  ? Years of education: Not on file  ? Highest education level: Not on file  ?Occupational History  ? Not on file  ?Tobacco Use  ? Smoking status: Never  ? Smokeless tobacco: Not on file  ?Substance and Sexual Activity  ? Alcohol use: No  ? Drug use: No  ? Sexual activity: Yes  ?Other Topics Concern  ? Not on file  ?Social History Narrative  ? Not on file  ? ?Social Determinants of Health  ? ?Financial Resource Strain: Not on file  ?Food Insecurity: Not on file  ?Transportation Needs: Not on file  ?Physical Activity: Not on file  ?Stress: Not on file  ?Social Connections: Not on file  ? ? ? ? ?Review of Systems see above; denies fever, chest pain, dyspnea, cough, back pain, or bleeding; continues to have some abdominal bloating/discomfort ? ?Vital Signs: ?BP 138/88 (BP Location: Left Arm)   Pulse 73   Temp 98.4 ?F (36.9 ?C) (Oral)   Resp 20   Ht 5' (1.524 m)   Wt 129 lb 13.6 oz (58.9 kg)   SpO2 100%   BMI 25.36 kg/m?  ? ?Physical Exam awake, alert.  Chest clear to auscultation bilaterally.  Heart with regular rate and rhythm.  Abdomen soft, positive bowel sounds, some minimal mid abdominal tenderness to palpation; no lower extremity edema. ? ?Imaging: ?DG Chest 2 View ? ?Result Date: 09/30/2021 ?CLINICAL DATA:  Nausea, fatigue EXAM: CHEST - 2 VIEW COMPARISON:  None. FINDINGS: Normal mediastinum and cardiac silhouette. Normal pulmonary  vasculature. No evidence of effusion, infiltrate, or pneumothorax. No acute bony abnormality. IMPRESSION: No acute cardiopulmonary process. Electronically Signed   By: Suzy Bouchard M.D.   On: 09/30/2021 15:31  ? ?CT Head Wo Contrast ? ?Result Date: 09/30/2021 ?CLINICAL DATA:  Headache.  Fatigue. EXAM: CT HEAD WITHOUT CONTRAST TECHNIQUE: Contiguous axial images were obtained from the base of the skull through the vertex without intravenous contrast. RADIATION DOSE REDUCTION: This exam was performed according to the departmental dose-optimization program which includes automated exposure control, adjustment of the mA and/or kV according to patient size and/or use of iterative reconstruction technique. COMPARISON:  None. FINDINGS: Brain: No mass lesion, hemorrhage, hydrocephalus, acute infarct, intra-axial, or extra-axial fluid collection. Basal ganglia calcifications are likely physiologic. Vascular: No hyperdense vessel or unexpected calcification. Skull: Normal Sinuses/Orbits: Normal imaged portions of the orbits and globes. Clear paranasal sinuses and mastoid air cells. Other: None. IMPRESSION: No acute intracranial abnormality. Basal ganglia calcifications, most likely physiologic in this 51 year old patient. Electronically Signed   By: Abigail Miyamoto M.D.   On: 09/30/2021 15:28  ? ?CT CHEST W CONTRAST ? ?Result Date: 10/03/2021 ?CLINICAL DATA:  Concern for malignancy, lymphadenopathy. Diffuse lesions in the liver and spleen. EXAM: CT CHEST WITH CONTRAST TECHNIQUE: Multidetector  CT imaging of the chest was performed during intravenous contrast administration. RADIATION DOSE REDUCTION: This exam was performed according to the departmental dose-optimization program which includes automated exposure control, adjustment of the mA and/or kV according to patient size and/or use of iterative reconstruction technique. CONTRAST:  19mL OMNIPAQUE IOHEXOL 300 MG/ML  SOLN COMPARISON:  CT abdomen pelvis dated October 02, 2021  FINDINGS: Cardiovascular: No significant vascular findings. Normal heart size. No pericardial effusion. Mediastinum/Nodes: Bilateral subcentimeter axillary lymph nodes in short axis. Subcentimeter right paratracheal

## 2021-10-04 NOTE — Progress Notes (Signed)
TC to patient's bedside RN to ask what time patient last had food or drink pre-procedure as this writer noted patient had received 10U at 1249 today. [place], RN asked patient during this phone call and reported that patient states that she did not eat today, and just "had some water" when she took her meds this morning. ?Lesia Hausen, RN ? ?

## 2021-10-04 NOTE — Progress Notes (Signed)
?  Progress Note ? ? ?PatientRamani Wade [id number] DOB: 07-10-1970 DOA: 09/30/2021     2 ?DOS: the patient was seen and examined on 10/04/2021 ?  ?Brief hospital course: ?51 y.o. female with past medical history of diabetes presented to the hospital with complaints of fatigue dizziness generalized weakness nausea epigastric discomfort.  Patient was taking vitamin D at home and was taking weekly dose more frequently.  In the ED patient was noted to be hypercalcemic.  Received IV fluid and Lasix and was admitted hospital for symptomatic hypercalcemia likely from vitamin D intoxication ? ?Assessment and Plan: ?* Hypercalcemia ?Vit D toxicity is top of differential.  On both Daily and weekly Vit D for past month, see HPI. ?Vit d level is borderline elevated ?PTH low ?Got 1 dose of lasix in ED ?Ca improving with IVF hydration ? ?Liver lesion ?-CT abd/pelvis reviewed. Innumerable liver lesions and splenic lesions seen. Multiple enlarged nodes also noted. Differentials include infectious vs malignant etiology ?-CT chest ordered and reviewed. No acute process identified ?-Have ordered liver biopsy per ID recs, pathology and cultures pending ? ?HIV test positive ([place]) ?ID following ?Afebrile ?HIV-1 and HIV-2 confirmed neg ?RPR NR ?AFB pending ?Hep B and Hep C neg ? ?Type 1 diabetes mellitus with diabetic polyneuropathy (HCC) ?Takes 50u daily of glargine in AM + 20-25 mealtime novolog TID at home ?Currently on 55u daily of glargine ?10u mealtime novolog ?Cont with SSI TID as needed while in hospital ?Glucose remain in the 200-300's. Increase semglee to 60 units ? ? ? ?  ? ?Subjective: States feeling better today. Moved bowels earlier in day with enema ? ?Physical Exam: ?Vitals:  ? 10/04/21 1232 10/04/21 1635 10/04/21 1645 10/04/21 1650  ?BP: 138/88 134/69 (!) 141/70 134/69  ?Pulse: 73 90 83 85  ?Resp: 20 (!) 25 16 18   ?Temp: 98.4 ?F (36.9 ?C)     ?TempSrc: Oral     ?SpO2: 100% 100% 99% 98%  ?Weight:      ?Height:       ? ?General exam: Conversant, in no acute distress ?Respiratory system: normal chest rise, clear, no audible wheezing ?Cardiovascular system: regular rhythm, s1-s2 ?Gastrointestinal system: Nondistended, nontender, pos BS ?Central nervous system: No seizures, no tremors ?Extremities: No cyanosis, no joint deformities ?Skin: No rashes, no pallor ?Psychiatry: Affect normal // no auditory hallucinations  ? ?Data Reviewed: ? ?Labs reviewed, Ca 11.8, PTH 4 ? ?Family Communication: Pt in room, family not at bedside ? ?Disposition: ?Status is: Inpatient ?Continue inpatient status because: Severity of illness ? Planned Discharge Destination: Home ? ? ? ?Author: ?Marylu Lund, MD ?10/04/2021 5:19 PM ? ?For on call review [email or web address].  ?

## 2021-10-04 NOTE — Progress Notes (Signed)
?  [facility] ? ? ?Reason for visit: Follow up on HIV screen positive ? ?Interval History: confirmatory testing for HIV1 and HIV2 both negative with negative RNAs.  ? ? ?Physical Exam: ?Constitutional:  ?Vitals:  ? 10/04/21 0547 10/04/21 0645  ?BP: (!) 161/101 (!) 162/85  ?Pulse: 72 68  ?Resp: 18   ?Temp: 98.1 ?F (36.7 ?C)   ?SpO2: 100%   ? patient appears in NAD ?Respiratory: Normal respiratory effort; CTA B ?Cardiovascular: RRR ? ?Review of Systems: ?Constitutional: negative for fevers and chills ? ?Lab Results  ?Component Value Date  ? WBC 6.5 10/04/2021  ? HGB 10.0 (L) 10/04/2021  ? HCT 30.8 (L) 10/04/2021  ? MCV 83.2 10/04/2021  ? PLT 262 10/04/2021  ?  ?Lab Results  ?Component Value Date  ? CREATININE 0.83 10/04/2021  ? BUN 13 10/04/2021  ? NA 138 10/04/2021  ? K 3.3 (L) 10/04/2021  ? CL 103 10/04/2021  ? CO2 28 10/04/2021  ?  ?Lab Results  ?Component Value Date  ? ALT 32 10/04/2021  ? AST 28 10/04/2021  ? ALKPHOS 79 10/04/2021  ?  ? ?Microbiology: ?Recent Results (from the past 240 hour(s))  ?Resp Panel by RT-PCR (Flu A&B, Covid) Nasopharyngeal Swab     Status: None  ? Collection Time: 09/30/21  6:39 PM  ? Specimen: Nasopharyngeal Swab; Nasopharyngeal(NP) swabs in vial transport medium  ?Result Value Ref Range Status  ? SARS Coronavirus 2 by RT PCR NEGATIVE NEGATIVE Final  ?  Comment: (NOTE) ?SARS-CoV-2 target nucleic acids are NOT DETECTED. ? ?The SARS-CoV-2 RNA is generally detectable in upper respiratory ?specimens during the acute phase of infection. The lowest ?concentration of SARS-CoV-2 viral copies this assay can detect is ?138 copies/mL. A negative result does not preclude SARS-Cov-2 ?infection and should not be used as the sole basis for treatment or ?other patient management decisions. A negative result may occur with  ?improper specimen collection/handling, submission of specimen other ?than nasopharyngeal swab, presence of viral mutation(s) within the ?areas targeted  by this assay, and inadequate number of viral ?copies(<138 copies/mL). A negative result must be combined with ?clinical observations, patient history, and epidemiological ?information. The expected result is Negative. ? ?Fact Sheet for Patients:  ?BloggerCourse.com ? ?Fact Sheet for Healthcare Providers:  ?SeriousBroker.it ? ?This test is no t yet approved or cleared by the [place] FDA and  ?has been authorized for detection and/or diagnosis of SARS-CoV-2 by ?FDA under an Emergency Use Authorization (EUA). This EUA will remain  ?in effect (meaning this test can be used) for the duration of the ?COVID-19 declaration under Section 564(b)(1) of the Act, 21 ?U.S.C.section 360bbb-3(b)(1), unless the authorization is terminated  ?or revoked sooner.  ? ? ?  ? Influenza A by PCR NEGATIVE NEGATIVE Final  ? Influenza B by PCR NEGATIVE NEGATIVE Final  ?  Comment: (NOTE) ?The Xpert Xpress SARS-CoV-2/FLU/RSV plus assay is intended as an aid ?in the diagnosis of influenza from Nasopharyngeal swab specimens and ?should not be used as a sole basis for treatment. Nasal washings and ?aspirates are unacceptable for Xpert Xpress SARS-CoV-2/FLU/RSV ?testing. ? ?Fact Sheet for Patients: ?BloggerCourse.com ? ?Fact Sheet for Healthcare Providers: ?SeriousBroker.it ? ?This test is not yet approved or cleared by the [place] FDA and ?has been authorized for detection and/or diagnosis of SARS-CoV-2 by ?FDA under an Emergency Use Authorization (EUA). This EUA will remain ?in effect (meaning this test can be used) for the duration of the ?COVID-19 declaration under  Section 564(b)(1) of the Act, 21 U.S.C. ?section 360bbb-3(b)(1), unless the authorization is terminated or ?revoked. ? ?Performed at [facility], [address] [place] ?[place], [place] [address] ?  ? ? ?Impression/Plan:  ?1. HIV screen positive - confirmatory testing  negative for HIV and this was discussed with the patient and her brother (with her permission).  No HIV 1 or 2.   ? ?2.  Liver lesions - unclear etiology.  Will need a biopsy for malignancy and to include AFB and fungal stains; AFB, fungal and bacterial cultures.   ?We can follow up with her if infectious etiology identified.  ? ?I will sign off, call with questions.  ? ?  ?

## 2021-10-04 NOTE — Progress Notes (Signed)
Hypoglycemic Event ? ?CBG: 43 ? ?Treatment: 8 oz juice/soda ? ?Symptoms: None ? ?Follow-up CBG: Time:2217 CBG Result:37 ? ?Possible Reasons for Event: Unknown ? ?Comments/MD notified: Reyes Ivan NP ? ? ? ?Ronnald Collum RN ? ? ?Hypoglycemic Event ? ?CBG: 37 ? ?Treatment: 1/2 amp dextrose  ? ?Symptoms: None ? ?Follow-up CBG: Time:2240 CBG Result:210 ? ?Possible Reasons for Event: Unknown ? ?Comments/MD notified: Reyes Ivan NP ? ? ?

## 2021-10-05 DIAGNOSIS — K769 Liver disease, unspecified: Secondary | ICD-10-CM | POA: Diagnosis not present

## 2021-10-05 DIAGNOSIS — Z21 Asymptomatic human immunodeficiency virus [HIV] infection status: Secondary | ICD-10-CM | POA: Diagnosis not present

## 2021-10-05 LAB — COMPREHENSIVE METABOLIC PANEL
ALT: 41 U/L (ref 0–44)
AST: 39 U/L (ref 15–41)
Albumin: 3.4 g/dL — ABNORMAL LOW (ref 3.5–5.0)
Alkaline Phosphatase: 79 U/L (ref 38–126)
Anion gap: 4 — ABNORMAL LOW (ref 5–15)
BUN: 10 mg/dL (ref 6–20)
CO2: 31 mmol/L (ref 22–32)
Calcium: 12.1 mg/dL — ABNORMAL HIGH (ref 8.9–10.3)
Chloride: 105 mmol/L (ref 98–111)
Creatinine, Ser: 0.88 mg/dL (ref 0.44–1.00)
GFR, Estimated: >60 mL/min (ref >60)
Glucose, Bld: 62 mg/dL — ABNORMAL LOW (ref 70–99)
Potassium: 3 mmol/L — ABNORMAL LOW (ref 3.5–5.1)
Sodium: 140 mmol/L (ref 135–145)
Total Bilirubin: 0.3 mg/dL (ref 0.3–1.2)
Total Protein: 8.1 g/dL (ref 6.5–8.1)

## 2021-10-05 LAB — GLUCOSE, CAPILLARY
Glucose-Capillary: 141 mg/dL — ABNORMAL HIGH (ref 70–99)
Glucose-Capillary: 74 mg/dL (ref 70–99)
Glucose-Capillary: 212 mg/dL — ABNORMAL HIGH (ref 70–99)
Glucose-Capillary: 190 mg/dL — ABNORMAL HIGH (ref 70–99)
Glucose-Capillary: 134 mg/dL — ABNORMAL HIGH (ref 70–99)
Glucose-Capillary: 218 mg/dL — ABNORMAL HIGH (ref 70–99)

## 2021-10-05 LAB — CBC
HCT: 33.1 % — ABNORMAL LOW (ref 36.0–46.0)
Hemoglobin: 11.1 g/dL — ABNORMAL LOW (ref 12.0–15.0)
MCH: 28 pg (ref 26.0–34.0)
MCHC: 33.5 g/dL (ref 30.0–36.0)
MCV: 83.4 fL (ref 80.0–100.0)
Platelets: 275 10*3/uL (ref 150–400)
RBC: 3.97 MIL/uL (ref 3.87–5.11)
RDW: 13.8 % (ref 11.5–15.5)
WBC: 7.3 10*3/uL (ref 4.0–10.5)
nRBC: 0 % (ref 0.0–0.2)

## 2021-10-05 MED ORDER — POTASSIUM CHLORIDE CRYS ER 20 MEQ PO TBCR
60.0000 meq | EXTENDED_RELEASE_TABLET | ORAL | Status: AC
  Administered 2021-10-05 (×2): 60 meq via ORAL
  Filled 2021-10-05 (×2): qty 3

## 2021-10-05 MED ORDER — SODIUM CHLORIDE 0.9 % IV SOLN: INTRAVENOUS | Status: DC

## 2021-10-05 MED ORDER — INSULIN GLARGINE-YFGN 100 UNIT/ML ~~LOC~~ SOLN
55.0000 [IU] | Freq: Every day | SUBCUTANEOUS | Status: DC
  Administered 2021-10-06 – 2021-10-08 (×3): 55 [IU] via SUBCUTANEOUS
  Filled 2021-10-05 (×3): qty 0.55

## 2021-10-05 NOTE — Progress Notes (Signed)
Hypoglycemic Event ? ?CBG: 50 ? ? ?Treatment: 8 oz juice/soda ? ?Symptoms: None ? ?Follow-up CBG: [id number] CBG Result:74 ? ?Possible Reasons for Event: Unknown ? ?Comments/MD notified:Chiu MD ? ? ? ?Deveron Furlong RN ? ?

## 2021-10-05 NOTE — Progress Notes (Signed)
?  Progress Note ? ? ?PatientAnginette Wade [id number] DOB: 10/20/1970 DOA: 09/30/2021     3 ?DOS: the patient was seen and examined on 10/05/2021 ?  ?Brief hospital course: ?51 y.o. female with past medical history of diabetes presented to the hospital with complaints of fatigue dizziness generalized weakness nausea epigastric discomfort.  Patient was taking vitamin D at home and was taking weekly dose more frequently.  In the ED patient was noted to be hypercalcemic.  Received IV fluid and Lasix and was admitted hospital for symptomatic hypercalcemia likely from vitamin D intoxication ? ?Assessment and Plan: ?* Hypercalcemia ?Vit D toxicity is top of differential.  On both Daily and weekly Vit D for past month, see HPI. ?Vit d level is elevated at nearly 92 ?PTH low ?Got 1 dose of lasix in ED ?Ca of 12.1 this AM ?Continue IVF hydration as tolerated ?Recheck bmet in AM ? ?Liver lesion ?-CT abd/pelvis reviewed. Innumerable liver lesions and splenic lesions seen. Multiple enlarged nodes also noted. Differentials include infectious vs malignant etiology ?-CT chest ordered and reviewed. No acute process identified ?-Have ordered liver biopsy per ID recs, pathology and cultures pending at this time ? 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 ?Type 1 diabetes mellitus with diabetic polyneuropathy (HCC) ?Takes 50u daily of glargine in AM + 20-25 mealtime novolog TID at home ?Currently on 60u daily of glargine ?10u mealtime novolog ?Cont with SSI TID as needed while in hospital ?Glucose lower this AM, have decreased glargine to 55 units ?Cont to encourage PO intake ? ? ? ?  ? ?Subjective: Reports feeling better but still thirsty. Eager to go home soon ? ?Physical Exam: ?Vitals:  ? 10/04/21 1650 10/04/21 2205 10/05/21 0454 10/05/21 1316  ?BP: 134/69 (!) 141/74 139/73 (!) 162/79  ?Pulse: 85 79 69 80  ?Resp: 18 18 (!) 22 16  ?Temp:  98.4 ?F (36.9  ?C) 98.2 ?F (36.8 ?C) 98.1 ?F (36.7 ?C)  ?TempSrc:  Oral Oral Oral  ?SpO2: 98% 98% 98% 98%  ?Weight:      ?Height:      ? ?General exam: Awake, laying in bed, in nad ?Respiratory system: Normal respiratory effort, no wheezing ?Cardiovascular system: regular rate, s1, s2 ?Gastrointestinal system: Soft, nondistended, positive BS ?Central nervous system: CN2-12 grossly intact, strength intact ?Extremities: Perfused, no clubbing ?Skin: Normal skin turgor, no notable skin lesions seen ?Psychiatry: Mood normal // no visual hallucinations  ? ?Data Reviewed: ? ?Labs reviewed, Ca 12.1, K 3.0 ? ?Family Communication: Pt in room, family not at bedside ? ?Disposition: ?Status is: Inpatient ?Continue inpatient status because: Severity of illness ? Planned Discharge Destination: Home ? ? ? ?Author: ?Marylu Lund, MD ?10/05/2021 4:44 PM ? ?For on call review [email or web address]  ?

## 2021-10-06 DIAGNOSIS — K769 Liver disease, unspecified: Secondary | ICD-10-CM | POA: Diagnosis not present

## 2021-10-06 DIAGNOSIS — R110 Nausea: Secondary | ICD-10-CM | POA: Diagnosis not present

## 2021-10-06 DIAGNOSIS — Z21 Asymptomatic human immunodeficiency virus [HIV] infection status: Secondary | ICD-10-CM | POA: Diagnosis not present

## 2021-10-06 LAB — COMPREHENSIVE METABOLIC PANEL
ALT: 40 U/L (ref 0–44)
AST: 33 U/L (ref 15–41)
Albumin: 3.1 g/dL — ABNORMAL LOW (ref 3.5–5.0)
Alkaline Phosphatase: 78 U/L (ref 38–126)
Anion gap: 7 (ref 5–15)
BUN: 14 mg/dL (ref 6–20)
CO2: 26 mmol/L (ref 22–32)
Calcium: 12.6 mg/dL — ABNORMAL HIGH (ref 8.9–10.3)
Chloride: 104 mmol/L (ref 98–111)
Creatinine, Ser: 1.05 mg/dL — ABNORMAL HIGH (ref 0.44–1.00)
GFR, Estimated: >60 mL/min (ref >60)
Glucose, Bld: 281 mg/dL — ABNORMAL HIGH (ref 70–99)
Potassium: 4.7 mmol/L (ref 3.5–5.1)
Sodium: 137 mmol/L (ref 135–145)
Total Bilirubin: 0.5 mg/dL (ref 0.3–1.2)
Total Protein: 7.4 g/dL (ref 6.5–8.1)

## 2021-10-06 LAB — CBC
HCT: 30.7 % — ABNORMAL LOW (ref 36.0–46.0)
Hemoglobin: 9.9 g/dL — ABNORMAL LOW (ref 12.0–15.0)
MCH: 27.3 pg (ref 26.0–34.0)
MCHC: 32.2 g/dL (ref 30.0–36.0)
MCV: 84.6 fL (ref 80.0–100.0)
Platelets: 254 10*3/uL (ref 150–400)
RBC: 3.63 MIL/uL — ABNORMAL LOW (ref 3.87–5.11)
RDW: 13.9 % (ref 11.5–15.5)
WBC: 8.1 10*3/uL (ref 4.0–10.5)
nRBC: 0 % (ref 0.0–0.2)

## 2021-10-06 LAB — GLUCOSE, CAPILLARY
Glucose-Capillary: 333 mg/dL — ABNORMAL HIGH (ref 70–99)
Glucose-Capillary: 294 mg/dL — ABNORMAL HIGH (ref 70–99)
Glucose-Capillary: 168 mg/dL — ABNORMAL HIGH (ref 70–99)
Glucose-Capillary: 42 mg/dL — CL (ref 70–99)
Glucose-Capillary: 44 mg/dL — CL (ref 70–99)
Glucose-Capillary: 86 mg/dL (ref 70–99)

## 2021-10-06 MED ORDER — CALCITONIN (SALMON) 200 UNIT/ML IJ SOLN
4.0000 [IU]/kg | Freq: Two times a day (BID) | INTRAMUSCULAR | Status: DC
  Administered 2021-10-06 – 2021-10-07 (×2): 236 [IU] via INTRAMUSCULAR
  Filled 2021-10-06 (×4): qty 1.18

## 2021-10-06 MED ORDER — KETOROLAC TROMETHAMINE 30 MG/ML IJ SOLN
30.0000 mg | Freq: Once | INTRAMUSCULAR | Status: AC
  Administered 2021-10-06: 30 mg via INTRAVENOUS
  Filled 2021-10-06: qty 1

## 2021-10-06 MED ORDER — PROCHLORPERAZINE EDISYLATE 10 MG/2ML IJ SOLN
INTRAMUSCULAR | Status: AC
Start: 2021-10-06 — End: 2021-10-06
  Administered 2021-10-06: 10 mg
  Filled 2021-10-06: qty 2

## 2021-10-06 MED ORDER — PROCHLORPERAZINE EDISYLATE 10 MG/2ML IJ SOLN
10.0000 mg | Freq: Four times a day (QID) | INTRAMUSCULAR | Status: DC | PRN
  Administered 2021-10-07: 10 mg via INTRAVENOUS
  Filled 2021-10-06: qty 2

## 2021-10-06 MED ORDER — DIAZEPAM 2 MG PO TABS: 2.0000 mg | ORAL_TABLET | Freq: Once | ORAL | Status: DC

## 2021-10-06 MED ORDER — ALPRAZOLAM 0.25 MG PO TABS
0.2500 mg | ORAL_TABLET | Freq: Once | ORAL | Status: AC
Start: 2021-10-06 — End: 2021-10-06
  Administered 2021-10-06: 0.25 mg via ORAL
  Filled 2021-10-06: qty 1

## 2021-10-06 MED ORDER — SODIUM CHLORIDE 0.9 % IV SOLN
90.0000 mg | Freq: Once | INTRAVENOUS | Status: AC
  Administered 2021-10-06: 90 mg via INTRAVENOUS
  Filled 2021-10-06: qty 10

## 2021-10-06 NOTE — Progress Notes (Signed)
?  Progress Note ? ? ?PatientShari Wade [id number] DOB: [birth year] DOA: 09/30/2021     4 ?DOS: the patient was seen and examined on 10/06/2021 ?  ?Brief hospital course: ?51 y.o. female with past medical history of diabetes presented to the hospital with complaints of fatigue dizziness generalized weakness nausea epigastric discomfort.  Patient was taking vitamin D at home and was taking weekly dose more frequently.  In the ED patient was noted to be hypercalcemic.  Received IV fluid and Lasix and was admitted hospital for symptomatic hypercalcemia likely from vitamin D intoxication ? ?Assessment and Plan: ?* Hypercalcemia ?Vit D was initially considered top of differential.  On both Daily and weekly Vit D for past month, see HPI. ?Vit d level is elevated at nearly 92 ?PTH low ?Despite IVF hydration, calcium remains elevated at 12.6, corrected to 13.2 based on alb of 3.1 ?Discussed with Nephrology. Recommendation for addition of pamidronate and calcitonin ? ?Liver lesion ?-CT abd/pelvis reviewed. Innumerable liver lesions and splenic lesions seen. Multiple enlarged nodes also noted. Differentials include infectious vs malignant etiology ?-CT chest ordered and reviewed. No acute process identified ?-Have ordered liver biopsy per ID recs, pathology and cultures pending at this time ?-AFB studies pending ? 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 ?Type 1 diabetes mellitus with diabetic polyneuropathy (HCC) ?Takes 50u daily of glargine in AM + 20-25 mealtime novolog TID at home ?Currently on 55u daily of glargine ?10u mealtime novolog ?Cont with SSI TID as needed while in hospital ?Today, glycemic trends stable ? ? ? ?  ? ?Subjective: Seen during lunch. Reports feeling better after toradol ? ?Physical Exam: ?Vitals:  ? 10/05/21 1316 10/05/21 2103 10/06/21 0344 10/06/21 1426  ?BP: (!) 162/79 101/76 (!) 161/79 (!) 155/74   ?Pulse: 80 78 74 86  ?Resp: 16 16 20 20   ?Temp: 98.1 ?F (36.7 ?C) 98.2 ?F (36.8 ?C) 98.2 ?F (36.8 ?C) 98.2 ?F (36.8 ?C)  ?TempSrc: Oral Oral Oral Oral  ?SpO2: 98% 97% 95% 96%  ?Weight:      ?Height:      ? ?General exam: Conversant, in no acute distress ?Respiratory system: normal chest rise, clear, no audible wheezing ?Cardiovascular system: regular rhythm, s1-s2 ?Gastrointestinal system: Nondistended, nontender, pos BS ?Central nervous system: No seizures, no tremors ?Extremities: No cyanosis, no joint deformities ?Skin: No rashes, no pallor ?Psychiatry: Affect normal // no auditory hallucinations  ? ?Data Reviewed: ? ?Labs reviewed, K 4.7, Ca 12.6, alb 3.1 ? ?Family Communication: Pt in room, family over phone ? ?Disposition: ?Status is: Inpatient ?Continue inpatient status because: Severity of illness ? Planned Discharge Destination: Home ? ? ? ?Author: ?Marylu Lund, MD ?10/06/2021 5:33 PM ? ?For on call review [email or web address]  ?

## 2021-10-07 ENCOUNTER — Inpatient Hospital Stay (HOSPITAL_COMMUNITY): Payer: 59

## 2021-10-07 DIAGNOSIS — K769 Liver disease, unspecified: Secondary | ICD-10-CM | POA: Diagnosis not present

## 2021-10-07 DIAGNOSIS — Z21 Asymptomatic human immunodeficiency virus [HIV] infection status: Secondary | ICD-10-CM | POA: Diagnosis not present

## 2021-10-07 DIAGNOSIS — R110 Nausea: Secondary | ICD-10-CM | POA: Diagnosis not present

## 2021-10-07 DIAGNOSIS — R531 Weakness: Secondary | ICD-10-CM | POA: Diagnosis not present

## 2021-10-07 LAB — GLUCOSE, CAPILLARY
Glucose-Capillary: 40 mg/dL — CL (ref 70–99)
Glucose-Capillary: 189 mg/dL — ABNORMAL HIGH (ref 70–99)
Glucose-Capillary: 37 mg/dL — CL (ref 70–99)
Glucose-Capillary: 43 mg/dL — CL (ref 70–99)
Glucose-Capillary: 210 mg/dL — ABNORMAL HIGH (ref 70–99)
Glucose-Capillary: 131 mg/dL — ABNORMAL HIGH (ref 70–99)
Glucose-Capillary: 50 mg/dL — ABNORMAL LOW (ref 70–99)
Glucose-Capillary: 143 mg/dL — ABNORMAL HIGH (ref 70–99)
Glucose-Capillary: 127 mg/dL — ABNORMAL HIGH (ref 70–99)
Glucose-Capillary: 108 mg/dL — ABNORMAL HIGH (ref 70–99)
Glucose-Capillary: 233 mg/dL — ABNORMAL HIGH (ref 70–99)
Glucose-Capillary: 55 mg/dL — ABNORMAL LOW (ref 70–99)
Glucose-Capillary: 78 mg/dL (ref 70–99)
Glucose-Capillary: 99 mg/dL (ref 70–99)

## 2021-10-07 LAB — CBC
HCT: 28 % — ABNORMAL LOW (ref 36.0–46.0)
Hemoglobin: 9.3 g/dL — ABNORMAL LOW (ref 12.0–15.0)
MCH: 27.7 pg (ref 26.0–34.0)
MCHC: 33.2 g/dL (ref 30.0–36.0)
MCV: 83.3 fL (ref 80.0–100.0)
Platelets: 265 10*3/uL (ref 150–400)
RBC: 3.36 MIL/uL — ABNORMAL LOW (ref 3.87–5.11)
RDW: 13.9 % (ref 11.5–15.5)
WBC: 8.9 10*3/uL (ref 4.0–10.5)
nRBC: 0 % (ref 0.0–0.2)

## 2021-10-07 LAB — COMPREHENSIVE METABOLIC PANEL
ALT: 33 U/L (ref 0–44)
AST: 25 U/L (ref 15–41)
Albumin: 2.9 g/dL — ABNORMAL LOW (ref 3.5–5.0)
Alkaline Phosphatase: 75 U/L (ref 38–126)
Anion gap: 7 (ref 5–15)
BUN: 14 mg/dL (ref 6–20)
CO2: 23 mmol/L (ref 22–32)
Calcium: 9.7 mg/dL (ref 8.9–10.3)
Chloride: 108 mmol/L (ref 98–111)
Creatinine, Ser: 0.83 mg/dL (ref 0.44–1.00)
GFR, Estimated: >60 mL/min (ref >60)
Glucose, Bld: 101 mg/dL — ABNORMAL HIGH (ref 70–99)
Potassium: 3.3 mmol/L — ABNORMAL LOW (ref 3.5–5.1)
Sodium: 138 mmol/L (ref 135–145)
Total Bilirubin: 0.5 mg/dL (ref 0.3–1.2)
Total Protein: 7 g/dL (ref 6.5–8.1)

## 2021-10-07 MED ORDER — ONDANSETRON HCL 4 MG PO TABS: 4.0000 mg | ORAL_TABLET | Freq: Four times a day (QID) | ORAL | 0 refills | Status: DC | PRN

## 2021-10-07 MED ORDER — PANTOPRAZOLE SODIUM 40 MG PO TBEC: 40.0000 mg | DELAYED_RELEASE_TABLET | Freq: Every day | ORAL | 0 refills | Status: DC

## 2021-10-07 NOTE — Discharge Summary (Signed)
?Physician Discharge Summary ?  ?Patient: [name] MRN: [id number] DOB: 1970-08-11  ?Admit date:     09/30/2021  ?Discharge date: 10/08/21  ?Discharge Physician: Marylu Lund  ? ?PCP: Kathreen Devoid, PA-C  ? ?Recommendations at discharge:  ? ? Follow up with PCP in 1-2 weeks ?Follow up with Oncology as scheduled ?Follow up with ID as scheduled ?Please follow up on pending labs: ANA, liver pathology, fungal pathology, AFB ?Would repeat BMET in 1-2 weeks, focus on calcium. Would also repeat vitamin D levels in 3-4 weeks ? ?Discharge Diagnoses: ?Principal Problem: ?  Hypercalcemia ?Active Problems: ?  Type 1 diabetes mellitus with diabetic polyneuropathy ([place]) ?  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  Liver lesion ?  Splenic lesion ? ?Resolved Problems: ?  * No resolved hospital problems. * ? ?Hospital Course: ?51 y.o. female with past medical history of diabetes presented to the hospital with complaints of fatigue dizziness generalized weakness nausea epigastric discomfort.  Patient was taking vitamin D at home and was taking weekly dose more frequently.  In the ED patient was noted to be hypercalcemic.  Received IV fluid and Lasix and was admitted hospital for symptomatic hypercalcemia likely from vitamin D intoxication ? ?Assessment and Plan: ?* Hypercalcemia ?Vit D was initially considered top of differential.  On both Daily and weekly Vit D for past month, see HPI. ?Vit d level is elevated at nearly 92 ?PTH low ?Had discussed with Nephrology. Recommendation for addition of pamidronate and calcitonin ?Calcium improved to 10.1, corrected to 10.7 ?Discussed with pharmacy, held further calcitonin ? ?Liver lesion ?-CT abd/pelvis reviewed. Innumerable liver lesions and splenic lesions seen. Multiple enlarged nodes also noted. Differentials include infectious vs malignant etiology ?-CT chest ordered and reviewed. No acute process identified ?-Have ordered liver biopsy per ID recs, pathology pending ?-Liver culture  neg ?-AFB studies pending ? 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 ?Type 1 diabetes mellitus with diabetic polyneuropathy (HCC) ?Takes 50u daily of glargine in AM + 20-25 mealtime novolog TID at home ?Currently on 55u daily of glargine ?10u mealtime novolog ?Cont with SSI TID as needed while in hospital ?Today, glycemic trends stable ? ? ? ? ? ?  ? ? ?Consultants: ID, Oncology ?Procedures performed: Liver biopsy  ?Disposition: Home ?Diet recommendation:  ?Carb modified diet ?DISCHARGE MEDICATION: ?Allergies as of 10/08/2021   ?No Known Allergies ?  ? ?  ?Medication List  ?  ? ?STOP taking these medications   ? ?VITAMIN D PO ?  ? ?  ? ?TAKE these medications   ? ?Contour Next Test test strip ?Generic drug: glucose blood ?4 (four) times daily. ?  ?FreeStyle Libre 2 Reader Devi ?1 Device by Does not apply route as directed. ?  ?FreeStyle Libre 2 Sensor Misc ?1 Device by Does not apply route every 14 (fourteen) days. ?  ?gabapentin 100 MG capsule ?Commonly known as: NEURONTIN ?Take 1 capsule (100 mg total) by mouth at bedtime. ?  ?Insulin Pen Needle 32G X 4 MM Misc ?1 Device by Does not apply route in the morning, at noon, in the evening, and at bedtime. ?  ?IRON PO ?Take 1 tablet by mouth daily. ?  ?lisinopril 20 MG tablet ?Commonly known as: ZESTRIL ?Take 1 tablet (20 mg total) by mouth daily. ?  ?NovoLOG FlexPen 100 UNIT/ML FlexPen ?Generic drug: insulin aspart ?Max daily 100 units ?What changed: additional instructions ?  ?  ondansetron 4 MG tablet ?Commonly known as: ZOFRAN ?Take 1 tablet (4 mg total) by mouth every 6 (six) hours as needed for nausea. ?  ?pantoprazole 40 MG tablet ?Commonly known as: PROTONIX ?Take 1 tablet (40 mg total) by mouth daily. ?  ?Toujeo SoloStar 300 UNIT/ML Solostar Pen ?Generic drug: insulin glargine (1 Unit Dial) ?Inject 46 Units into the skin daily. ?What changed: how much to take ?  ? ?   ? ? Follow-up Information   ? ? Hancock, [place], PA-C Follow up in 2 week(s).   ?Specialty: Internal Medicine ?Why: Hospital follow up ?Contact information: ?[address] ?[address] ?[place] [address] ?[phone] ? ? ?  ?  ? ? Jaci Standard, MD Follow up.   ?Specialty: Hematology and Oncology ?Why: Hospital follow up ?Contact information: ?[address] ?[place] [address] ?[phone] ? ? ?  ?  ? ? Gardiner Barefoot, MD Follow up.   ?Specialty: Infectious Diseases ?Why: as scheduled ?Contact information: ?[address] ?[address] ?[place] [address] ?[phone] ? ? ?  ?  ? ? Shamleffer, Konrad Dolores, MD Follow up.   ?Specialties: Endocrinology, Radiology ?Why: Hospital follow up ?Contact information: ?[address] ?[address] ?[place] [address] ?[phone] ? ? ?  ?  ? ? Clayborne Dana, NP Follow up.   ?Specialties: Family Medicine, Emergency Medicine ?Why: Hospital follow up ?Contact information: ?[address] ?[address] ?[place] [address] ?[phone] ? ? ?  ?  ? ?  ?  ? ?  ? ?Discharge Exam: ?Filed Weights  ? 09/30/21 1239 09/30/21 2052  ?Weight: 56.7 kg 58.9 kg  ? ?General exam: Awake, laying in bed, in nad ?Respiratory system: Normal respiratory effort, no wheezing ?Cardiovascular system: regular rate, s1, s2 ?Gastrointestinal system: Soft, nondistended, positive BS ?Central nervous system: CN2-12 grossly intact, strength intact ?Extremities: Perfused, no clubbing ?Skin: Normal skin turgor, no notable skin lesions seen ?Psychiatry: Mood normal // no visual hallucinations  ? ?Condition at discharge: fair ? ?The results of significant diagnostics from this hospitalization (including imaging, microbiology, ancillary and laboratory) are listed below for reference.  ? ?Imaging Studies: ?DG Chest 2 View ? ?Result Date: 09/30/2021 ?CLINICAL DATA:  Nausea, fatigue EXAM: CHEST - 2 VIEW COMPARISON:  None. FINDINGS: Normal mediastinum and cardiac silhouette. Normal  pulmonary vasculature. No evidence of effusion, infiltrate, or pneumothorax. No acute bony abnormality. IMPRESSION: No acute cardiopulmonary process. Electronically Signed   By: Genevive Bi M.D.   On: 09/30/2021 15:31  ? ?DG Abd 1 View ? ?Result Date: 10/07/2021 ?CLINICAL DATA:  Constipation EXAM: ABDOMEN - 1 VIEW COMPARISON:  CT abdomen and pelvis 10/02/2021 FINDINGS: Normal bowel gas pattern. No abnormal retained stool burden. Osseous structures unremarkable. Few pelvic phleboliths. No urinary tract calcification. IMPRESSION: Normal exam. Specifically no abnormal retained stool burden identified. Electronically Signed   By: Ulyses Southward M.D.   On: 10/07/2021 17:11  ? ?CT Head Wo Contrast ? ?Result Date: 09/30/2021 ?CLINICAL DATA:  Headache.  Fatigue. EXAM: CT HEAD WITHOUT CONTRAST TECHNIQUE: Contiguous axial images were obtained from the base of the skull through the vertex without intravenous contrast. RADIATION DOSE REDUCTION: This exam was performed according to the departmental dose-optimization program which includes automated exposure control, adjustment of the mA and/or kV according to patient size and/or use of iterative reconstruction technique. COMPARISON:  None. FINDINGS: Brain: No mass lesion, hemorrhage, hydrocephalus, acute infarct, intra-axial, or extra-axial fluid collection. Basal ganglia calcifications are likely  physiologic. Vascular: No hyperdense vessel or unexpected calcification. Skull: Normal Sinuses/Orbits: Normal imaged portions of the orbits and globes. Clear paranasal sinuses and mastoid air cells. Other: None. IMPRESSION: No acute intracranial abnormality. Basal ganglia calcifications, most likely physiologic in this 51 year old patient. Electronically Signed   By: Abigail Miyamoto M.D.   On: 09/30/2021 15:28  ? ?CT CHEST W CONTRAST ? ?Result Date: 10/03/2021 ?CLINICAL DATA:  Concern for malignancy, lymphadenopathy. Diffuse lesions in the liver and spleen. EXAM: CT CHEST WITH CONTRAST  TECHNIQUE: Multidetector CT imaging of the chest was performed during intravenous contrast administration. RADIATION DOSE REDUCTION: This exam was performed according to the departmental dose-optimization program

## 2021-10-07 NOTE — Progress Notes (Signed)
?  Progress Note ? ? ?PatientMarielly Wade [id number] DOB: [birth year] DOA: 09/30/2021     5 ?DOS: the patient was seen and examined on 10/07/2021 ?  ?Brief hospital course: ?51 y.o. female with past medical history of diabetes presented to the hospital with complaints of fatigue dizziness generalized weakness nausea epigastric discomfort.  Patient was taking vitamin D at home and was taking weekly dose more frequently.  In the ED patient was noted to be hypercalcemic.  Received IV fluid and Lasix and was admitted hospital for symptomatic hypercalcemia likely from vitamin D intoxication ? ?Assessment and Plan: ?* Hypercalcemia ?Vit D was initially considered top of differential.  On both Daily and weekly Vit D for past month, see HPI. ?Vit d level is elevated at nearly 92 ?PTH low ?Had discussed with Nephrology. Recommendation for addition of pamidronate and calcitonin ?Calcium improved to 9.7, corrected to 10.58 ?Discussed with pharmacy, held further calcitonin ? ?Liver lesion ?-CT abd/pelvis reviewed. Innumerable liver lesions and splenic lesions seen. Multiple enlarged nodes also noted. Differentials include infectious vs malignant etiology ?-CT chest ordered and reviewed. No acute process identified ?-Have ordered liver biopsy per ID recs, pathology pending ?-Liver culture neg ?-AFB studies pending ? 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 ?Type 1 diabetes mellitus with diabetic polyneuropathy (HCC) ?Takes 50u daily of glargine in AM + 20-25 mealtime novolog TID at home ?Currently on 55u daily of glargine ?10u mealtime novolog ?Cont with SSI TID as needed while in hospital ?Today, glycemic trends stable ? ? ? ?  ? ?Subjective: Earlier reported feeling better, only complaining of dry mouth. Later reported nausea with vomiting ? ?Physical Exam: ?Vitals:  ? 10/06/21 1426 10/06/21 2053 10/07/21 0227 10/07/21 1206  ?BP:  (!) 155/74 (!) 153/86 (!) 144/75 139/76  ?Pulse: 86 (!) 105 99 88  ?Resp: 20 20 16 18   ?Temp: 98.2 ?F (36.8 ?C) 98.9 ?F (37.2 ?C) 98.1 ?F (36.7 ?C) 98.7 ?F (37.1 ?C)  ?TempSrc: Oral Oral Oral Oral  ?SpO2: 96% 98% 96% 97%  ?Weight:      ?Height:      ? ?General exam: Awake, laying in bed, in nad ?Respiratory system: Normal respiratory effort, no wheezing ?Cardiovascular system: regular rate, s1, s2 ?Gastrointestinal system: Soft, nondistended, positive BS ?Central nervous system: CN2-12 grossly intact, strength intact ?Extremities: Perfused, no clubbing ?Skin: Normal skin turgor, no notable skin lesions seen ?Psychiatry: Mood normal // no visual hallucinations  ? ?Data Reviewed: ? ?Labs reviewed, K 3.3, corrected Ca 10.5 ? ?Family Communication: Pt in room, family over phone ? ?Disposition: ?Status is: Inpatient ?Continue inpatient status because: Severity of illness ? Planned Discharge Destination: Home ? ? ? ?Author: ?Marylu Lund, MD ?10/07/2021 7:29 PM ? ?For on call review [email or web address]  ?

## 2021-10-08 ENCOUNTER — Telehealth: Payer: Self-pay | Admitting: Hematology and Oncology

## 2021-10-08 LAB — ACID FAST SMEAR (AFB, MYCOBACTERIA): Acid Fast Smear: NEGATIVE

## 2021-10-08 LAB — COMPREHENSIVE METABOLIC PANEL
ALT: 44 U/L (ref 0–44)
AST: 40 U/L (ref 15–41)
Albumin: 3.2 g/dL — ABNORMAL LOW (ref 3.5–5.0)
Alkaline Phosphatase: 75 U/L (ref 38–126)
Anion gap: 5 (ref 5–15)
BUN: 12 mg/dL (ref 6–20)
CO2: 21 mmol/L — ABNORMAL LOW (ref 22–32)
Calcium: 10.1 mg/dL (ref 8.9–10.3)
Chloride: 111 mmol/L (ref 98–111)
Creatinine, Ser: 0.75 mg/dL (ref 0.44–1.00)
GFR, Estimated: >60 mL/min (ref >60)
Glucose, Bld: 49 mg/dL — ABNORMAL LOW (ref 70–99)
Potassium: 2.9 mmol/L — ABNORMAL LOW (ref 3.5–5.1)
Sodium: 137 mmol/L (ref 135–145)
Total Bilirubin: 0.3 mg/dL (ref 0.3–1.2)
Total Protein: 7.6 g/dL (ref 6.5–8.1)

## 2021-10-08 LAB — CULTURE, BLOOD (ROUTINE X 2)
Culture: NO GROWTH
Culture: NO GROWTH

## 2021-10-08 LAB — GLUCOSE, CAPILLARY
Glucose-Capillary: 129 mg/dL — ABNORMAL HIGH (ref 70–99)
Glucose-Capillary: 74 mg/dL (ref 70–99)
Glucose-Capillary: 110 mg/dL — ABNORMAL HIGH (ref 70–99)

## 2021-10-08 MED ORDER — POTASSIUM CHLORIDE CRYS ER 20 MEQ PO TBCR
60.0000 meq | EXTENDED_RELEASE_TABLET | ORAL | Status: AC
  Administered 2021-10-08 (×2): 60 meq via ORAL
  Filled 2021-10-08 (×2): qty 3

## 2021-10-08 NOTE — Progress Notes (Signed)
CBG 55, initiated standing order, orange juice given to patient.  ?

## 2021-10-08 NOTE — Progress Notes (Signed)
CBG is now 99.  ?

## 2021-10-08 NOTE — Telephone Encounter (Signed)
Per 4/10 inbasket called pt with no answer and voicemail was full, so called alternate phone which was son, he took pts message about upcoming appointment.  Appointment confirmed  ?

## 2021-10-08 NOTE — Progress Notes (Signed)
CBG 78, asked the patient to finish the orange juice that she was given previously.  ?

## 2021-10-09 LAB — BLASTOMYCES ANTIGEN: Blastomyces Antigen: NOT DETECTED ng/mL

## 2021-10-09 LAB — HISTOPLASMA ANTIGEN, URINE: Histoplasma Antigen, urine: <0.5 (ref <0.5)

## 2021-10-09 LAB — QUANTIFERON-TB GOLD PLUS (RQFGPL)
QuantiFERON Mitogen Value: >10 IU/mL
QuantiFERON Nil Value: 0.13 IU/mL
QuantiFERON TB1 Ag Value: 0.12 IU/mL
QuantiFERON TB2 Ag Value: 0.15 IU/mL

## 2021-10-09 LAB — QUANTIFERON-TB GOLD PLUS: QuantiFERON-TB Gold Plus: NEGATIVE

## 2021-10-09 LAB — ANA W/REFLEX IF POSITIVE: Anti Nuclear Antibody (ANA): NEGATIVE

## 2021-10-09 LAB — SURGICAL PATHOLOGY

## 2021-10-11 LAB — PTH-RELATED PEPTIDE: PTH-related peptide: <2 pmol/L

## 2021-10-12 LAB — AEROBIC/ANAEROBIC CULTURE W GRAM STAIN (SURGICAL/DEEP WOUND): Culture: NO GROWTH

## 2021-10-14 ENCOUNTER — Encounter: Payer: Self-pay | Admitting: Hematology and Oncology

## 2021-10-15 ENCOUNTER — Telehealth: Payer: Self-pay

## 2021-10-15 NOTE — Telephone Encounter (Signed)
New patient referral faxed to: ?  ?Dr. Tobey Bride Office ?[facility] ?[address]  ?[place], [place] [address] ?Phone: [phone] ?Fax: [phone] ? ?My chart message sent to patient. ?

## 2021-10-16 ENCOUNTER — Encounter: Payer: Self-pay | Admitting: *Deleted

## 2021-10-16 ENCOUNTER — Encounter: Payer: Self-pay | Admitting: Internal Medicine

## 2021-10-16 ENCOUNTER — Ambulatory Visit (INDEPENDENT_AMBULATORY_CARE_PROVIDER_SITE_OTHER): Payer: 59 | Admitting: Internal Medicine

## 2021-10-16 ENCOUNTER — Other Ambulatory Visit: Payer: Self-pay

## 2021-10-16 DIAGNOSIS — D8689 Sarcoidosis of other sites: Secondary | ICD-10-CM | POA: Diagnosis not present

## 2021-10-17 ENCOUNTER — Encounter: Payer: Self-pay | Admitting: Internal Medicine

## 2021-10-17 NOTE — Assessment & Plan Note (Signed)
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  All questions answered to the best of my ability.   ?She has already been referred to rheumatology by oncology.   ? ?She can follow up here as needed. ? ?I have personally spent 30 minutes involved in face-to-face and non-face-to-face activities for this patient on the day of the visit. Professional time spent includes the following activities: Preparing to see the patient (review of tests), Obtaining and/or reviewing separately obtained history (admission/discharge record), Performing a medically appropriate examination and/or evaluation , Ordering medications/tests/procedures, referring and communicating with other health care professionals, Documenting clinical information in the EMR, Independently interpreting results (not separately reported), Communicating results to the patient/family/caregiver, Counseling and educating the patient/family/caregiver and Care coordination (not separately reported).  ?

## 2021-10-17 NOTE — Progress Notes (Signed)
? ?  Subjective:  ? ? Patient ID: [name], female    DOB: July 01, 1970, 51 y.o.   MRN: [id number] ? ?HPI ?She is here for hsfu ?She was hospitalized earlier this month with generalized feelings of weakness, fatigue, nausea and epigastric discomfort and a CT scan of her abdomen revealed innumerable hypodense lesion in her splenic parenchyma and hepatic parenchyma of initially unclear etiology.  She underwent liver biopsy and is here now for the results, which are c/w necrotizing granulomas most c/w hepatic sarcoidosis.  She otherwise continues to feel weak.  She has a history of a chronic rash and is also followed by dermatology.   ?We also saw her for a positive HIV screen with a negative confirmatory test c/w a false positive screen.   ? ? ?Review of Systems  ?Constitutional:  Negative for chills and fever.  ?Gastrointestinal:  Negative for diarrhea.  ? ?   ?Objective:  ? Physical Exam ?Eyes:  ?   General: No scleral icterus. ?Skin: ?   Findings: Rash present.  ?Neurological:  ?   Mental Status: She is alert.  ?Psychiatric:     ?   Mood and Affect: Mood normal.  ? ?SH: no tobacco ? ? ? ?   ?Assessment & Plan:  ? ? ?

## 2021-10-21 ENCOUNTER — Inpatient Hospital Stay: Payer: 59

## 2021-10-21 ENCOUNTER — Inpatient Hospital Stay: Payer: 59 | Admitting: Hematology and Oncology

## 2021-10-24 ENCOUNTER — Encounter: Payer: Self-pay | Admitting: Family Medicine

## 2021-10-24 ENCOUNTER — Ambulatory Visit (INDEPENDENT_AMBULATORY_CARE_PROVIDER_SITE_OTHER): Payer: 59 | Admitting: Family Medicine

## 2021-10-24 VITALS — BP 133/82 | HR 95 | Ht 60.0 in | Wt 124.4 lb

## 2021-10-24 DIAGNOSIS — R5383 Other fatigue: Secondary | ICD-10-CM | POA: Diagnosis not present

## 2021-10-24 DIAGNOSIS — D8689 Sarcoidosis of other sites: Secondary | ICD-10-CM

## 2021-10-24 DIAGNOSIS — Z1231 Encounter for screening mammogram for malignant neoplasm of breast: Secondary | ICD-10-CM | POA: Diagnosis not present

## 2021-10-24 LAB — BASIC METABOLIC PANEL
BUN: 22 mg/dL (ref 6–23)
CO2: 28 mEq/L (ref 19–32)
Calcium: 10.7 mg/dL — ABNORMAL HIGH (ref 8.4–10.5)
Chloride: 99 mEq/L (ref 96–112)
Creatinine, Ser: 0.76 mg/dL (ref 0.40–1.20)
GFR: 90.96 mL/min (ref >60.00)
Glucose, Bld: 248 mg/dL — ABNORMAL HIGH (ref 70–99)
Potassium: 4.4 mEq/L (ref 3.5–5.1)
Sodium: 134 mEq/L — ABNORMAL LOW (ref 135–145)

## 2021-10-24 LAB — CBC WITH DIFFERENTIAL/PLATELET
Basophils Absolute: 0 10*3/uL (ref 0.0–0.1)
Basophils Relative: 0.4 % (ref 0.0–3.0)
Eosinophils Absolute: 0.3 10*3/uL (ref 0.0–0.7)
Eosinophils Relative: 4.2 % (ref 0.0–5.0)
HCT: 34.5 % — ABNORMAL LOW (ref 36.0–46.0)
Hemoglobin: 11.4 g/dL — ABNORMAL LOW (ref 12.0–15.0)
Lymphocytes Relative: 27.9 % (ref 12.0–46.0)
Lymphs Abs: 1.9 10*3/uL (ref 0.7–4.0)
MCHC: 33 g/dL (ref 30.0–36.0)
MCV: 82.8 fl (ref 78.0–100.0)
Monocytes Absolute: 0.8 10*3/uL (ref 0.1–1.0)
Monocytes Relative: 10.9 % (ref 3.0–12.0)
Neutro Abs: 3.9 10*3/uL (ref 1.4–7.7)
Neutrophils Relative %: 56.6 % (ref 43.0–77.0)
Platelets: 337 10*3/uL (ref 150.0–400.0)
RBC: 4.17 Mil/uL (ref 3.87–5.11)
RDW: 14.6 % (ref 11.5–15.5)
WBC: 6.9 10*3/uL (ref 4.0–10.5)

## 2021-10-24 NOTE — Progress Notes (Signed)
? ?New Patient Office Visit ? ?Subjective   ? ?Patient ID: [name], female    DOB: 11/10/1970  Age: 51 y.o. MRN: [id number] ? ?CC: establish care, hospital follow-up ? ? ?HPI ?[name] presents to establish care ? ?Patient was hospitalized at [facility] from April 3-11, 2023 for fatigue, dizziness, weakness, nausea, epigastric discomfort. She was noted to be hypercalcemic and was give IV fluids/lasix and admitted for symptomatic hypercalcemia likely from vitamin D intoxication. CT Abd/Pelvis revealed innumerable liver/spleen lesions and enlarged nodes. Liver biopsy was ordered by Infectious Disease. 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 She is waiting to get appointment with rheumatologist.  ? ?She has Type 1 Diabetes and is followed by endocrinology. She is due to reschedule appointment that she missed during hospitalization. Blood sugars have been stable. No hypo/hyperglycemic episodes.  ? ?She continues to feel tired, achy. Has not been taking her Vitamin D as she was told. She also stopped taking her B12. Will order recheck in about 2 weeks based on discharge note. Likely autoimmune related - follow with rheumatology.  ? ? ? ?Outpatient Encounter Medications as of 10/24/2021  ?Medication Sig  ? omeprazole (PRILOSEC) 20 MG capsule Take 20 mg by mouth daily.  ? Continuous Blood Gluc Receiver (FREESTYLE LIBRE 2 READER) DEVI 1 Device by Does not apply route as directed.  ? Continuous Blood Gluc Sensor (FREESTYLE LIBRE 2 SENSOR) MISC 1 Device by Does not apply route every 14 (fourteen) days.  ? Ferrous Sulfate (IRON PO) Take 1 tablet by mouth daily. (Patient not taking: Reported on 10/16/2021)  ? glucose blood (CONTOUR NEXT TEST)  test strip 4 (four) times daily.  ? insulin aspart (NOVOLOG FLEXPEN) 100 UNIT/ML FlexPen Max daily 100 units (Patient taking differently: Max daily 100 units; sliding scale)  ? Insulin Pen Needle 32G X 4 MM MISC 1 Device by Does not apply route in the morning, at noon, in the evening, and at bedtime.  ? lisinopril (ZESTRIL) 20 MG tablet Take 1 tablet (20 mg total) by mouth daily.  ? ondansetron (ZOFRAN) 4 MG tablet Take 1 tablet (4 mg total) by mouth every 6 (six) hours as needed for nausea. (Patient not taking: Reported on 10/16/2021)  ? TOUJEO SOLOSTAR 300 UNIT/ML Solostar Pen Inject 46 Units into the skin daily. (Patient taking differently: Inject 60 Units into the skin daily.)  ? [DISCONTINUED] gabapentin (NEURONTIN) 100 MG capsule Take 1 capsule (100 mg total) by mouth at bedtime. (Patient not taking: Reported on 10/16/2021)  ? [DISCONTINUED] pantoprazole (PROTONIX) 40 MG tablet Take 1 tablet (40 mg total) by mouth daily. (Patient not taking: Reported on 10/16/2021)  ? ?No facility-administered encounter medications on file as of 10/24/2021.  ? ? ?Past Medical History:  ?Diagnosis Date  ? Type 2 diabetes mellitus ([place])   ? ? ?Past Surgical History:  ?Procedure Laterality Date  ? CESAREAN SECTION  03/05/2012  ? Procedure: CESAREAN SECTION;  Surgeon: Avel Sensor, MD;  Location: [facility];  Service: Obstetrics;  Laterality: N/A;  ? ? ?No family history on file. ? ?Social History  ? ?Socioeconomic History  ? Marital status: Married  ?  Spouse name: Not on file  ? Number of children: Not on file  ? Years of education:  Not on file  ? Highest education level: Not on file  ?Occupational History  ? Not on file  ?Tobacco Use  ? Smoking status: Never  ? Smokeless tobacco: Not on file  ?Substance and Sexual Activity  ? Alcohol use: No  ? Drug use: No  ? Sexual activity: Yes  ?Other Topics Concern  ? Not on file  ?Social History Narrative  ? Not on file  ? ?Social Determinants of Health  ? ?Financial Resource Strain: Not on  file  ?Food Insecurity: Not on file  ?Transportation Needs: Not on file  ?Physical Activity: Not on file  ?Stress: Not on file  ?Social Connections: Not on file  ?Intimate Partner Violence: Not on file  ? ? ?ROS ?All review of systems negative except what is listed in the HPI ? ?  ? ? ?Objective   ? ?BP 133/82   Pulse 95   Ht 5' (1.524 m)   Wt 124 lb 6.4 oz (56.4 kg)   BMI 24.30 kg/m?  ? ?Physical Exam ?Vitals reviewed.  ?Constitutional:   ?   Appearance: Normal appearance. She is normal weight.  ?Cardiovascular:  ?   Rate and Rhythm: Normal rate and regular rhythm.  ?Pulmonary:  ?   Effort: Pulmonary effort is normal.  ?   Breath sounds: Normal breath sounds.  ?Musculoskeletal:  ?   Right lower leg: No edema.  ?   Left lower leg: No edema.  ?Neurological:  ?   General: No focal deficit present.  ?   Mental Status: She is alert and oriented to person, place, and time. Mental status is at baseline.  ?Psychiatric:     ?   Mood and Affect: Mood normal.     ?   Behavior: Behavior normal.     ?   Thought Content: Thought content normal.     ?   Judgment: Judgment normal.  ? ? ? ?  ? ?Assessment & Plan:  ? ?1. Hepatic granuloma associated with sarcoidosis ?2. Fatigue, unspecified type ?3. Hypercalcemia ?Repeating CBC/BMP today and ordering future recheck of Vitamin D and B12. She is waiting to hear from rheumatology - gave her the phone number to check-in if she doesn't hear anything in the next several days. Patient aware of signs/symptoms requiring further/urgent evaluation.  ?- Basic metabolic panel ?- [id number]; Future ?- CBC with Differential/Platelet ? ? ?4. Encounter for screening mammogram for malignant neoplasm of breast ?- MM DIGITAL SCREENING BILATERAL; Future ? ? ?Return in about 3 months (around 01/23/2022) for routine f/u; 2 week lab appointment .  ? ? ?I spent 30 minutes dedicated to the care of this patient on the date of this encounter to include pre-visit chart review of prior notes and results,  face-to-face time with the patient performing a medically appropriate exam, counseling/education regarding sarcoidosis, and post-visit documentation and ordering of labs as indicated.  ? ? ?Terrilyn Saver, NP ? ? ?

## 2021-10-24 NOTE — Patient Instructions (Addendum)
Recheck blood counts and BMP today - kidney function and electrolytes. ?Schedule a 2 week lab appointment for [place] to recheck you Vitamin D and B12 levels.  ?Rheumatology referral was sent to [facility] [phone] (Dr. Dierdre Forth) ?Be sure to reschedule your diabetes follow-up appointment with endocrinology ? ?

## 2021-10-25 NOTE — Addendum Note (Signed)
Addended by: Hyman Hopes B on: 10/25/2021 08:04 AM ? ? Modules accepted: Orders ? ?

## 2021-10-29 ENCOUNTER — Encounter: Payer: Self-pay | Admitting: Internal Medicine

## 2021-10-30 ENCOUNTER — Encounter (HOSPITAL_BASED_OUTPATIENT_CLINIC_OR_DEPARTMENT_OTHER): Payer: Self-pay

## 2021-10-30 ENCOUNTER — Ambulatory Visit (HOSPITAL_BASED_OUTPATIENT_CLINIC_OR_DEPARTMENT_OTHER)
Admission: RE | Admit: 2021-10-30 | Discharge: 2021-10-30 | Disposition: A | Discharge status: Home / Self Care | Payer: 59 | Source: Ambulatory Visit | Attending: Family Medicine | Admitting: Family Medicine

## 2021-10-30 DIAGNOSIS — Z1231 Encounter for screening mammogram for malignant neoplasm of breast: Secondary | ICD-10-CM | POA: Diagnosis present

## 2021-10-31 ENCOUNTER — Encounter: Payer: Self-pay | Admitting: Family Medicine

## 2021-11-01 ENCOUNTER — Other Ambulatory Visit: Payer: Self-pay | Admitting: Family Medicine

## 2021-11-01 ENCOUNTER — Encounter: Payer: Self-pay | Admitting: Family Medicine

## 2021-11-01 DIAGNOSIS — R928 Other abnormal and inconclusive findings on diagnostic imaging of breast: Secondary | ICD-10-CM

## 2021-11-14 ENCOUNTER — Ambulatory Visit: Payer: 59 | Admitting: Family Medicine

## 2021-11-14 ENCOUNTER — Ambulatory Visit: Payer: 59 | Admitting: *Deleted

## 2021-11-14 DIAGNOSIS — R5383 Other fatigue: Secondary | ICD-10-CM

## 2021-11-14 LAB — VITAMIN D 25 HYDROXY (VIT D DEFICIENCY, FRACTURES): VITD: 33.15 ng/mL (ref 30.00–100.00)

## 2021-11-14 LAB — COMPREHENSIVE METABOLIC PANEL
ALT: 21 U/L (ref 0–35)
AST: 23 U/L (ref 0–37)
Albumin: 3.9 g/dL (ref 3.5–5.2)
Alkaline Phosphatase: 90 U/L (ref 39–117)
BUN: 14 mg/dL (ref 6–23)
CO2: 28 mEq/L (ref 19–32)
Calcium: 9.5 mg/dL (ref 8.4–10.5)
Chloride: 103 mEq/L (ref 96–112)
Creatinine, Ser: 0.69 mg/dL (ref 0.40–1.20)
GFR: 100.71 mL/min (ref >60.00)
Glucose, Bld: 94 mg/dL (ref 70–99)
Potassium: 4.4 mEq/L (ref 3.5–5.1)
Sodium: 137 mEq/L (ref 135–145)
Total Bilirubin: 0.4 mg/dL (ref 0.2–1.2)
Total Protein: 8 g/dL (ref 6.0–8.3)

## 2021-11-14 LAB — VITAMIN B12: Vitamin B-12: 449 pg/mL (ref 211–911)

## 2021-11-17 LAB — ACID FAST CULTURE WITH REFLEXED SENSITIVITIES (MYCOBACTERIA): Acid Fast Culture: NEGATIVE

## 2021-11-20 LAB — ACID FAST CULTURE WITH REFLEXED SENSITIVITIES (MYCOBACTERIA): Acid Fast Culture: NEGATIVE

## 2021-11-22 ENCOUNTER — Telehealth: Payer: Self-pay | Admitting: Internal Medicine

## 2021-11-22 ENCOUNTER — Ambulatory Visit: Payer: Medicaid Other | Admitting: Internal Medicine

## 2021-11-22 ENCOUNTER — Encounter: Payer: Self-pay | Admitting: Family Medicine

## 2021-11-22 NOTE — Telephone Encounter (Signed)
Patient came into office and is requesting "large" pen needles be called in for her.  States that the small ones hurt too much.  Requesting new RX be sent to the [facility] on [address] [place]   Call back # [phone]

## 2021-11-26 ENCOUNTER — Other Ambulatory Visit: Payer: Self-pay

## 2021-11-26 ENCOUNTER — Other Ambulatory Visit: Payer: Self-pay | Admitting: *Deleted

## 2021-11-26 DIAGNOSIS — N6489 Other specified disorders of breast: Secondary | ICD-10-CM

## 2021-11-26 MED ORDER — PEN NEEDLES 31G X 5 MM MISC
3 refills | Status: AC
Start: 2021-11-26 — End: ?

## 2021-11-26 NOTE — Telephone Encounter (Signed)
New size needle sent into the pharmacy per patient request

## 2021-11-27 ENCOUNTER — Emergency Department (HOSPITAL_BASED_OUTPATIENT_CLINIC_OR_DEPARTMENT_OTHER): Payer: 59

## 2021-11-27 ENCOUNTER — Encounter: Payer: Self-pay | Admitting: Family Medicine

## 2021-11-27 ENCOUNTER — Telehealth: Payer: Self-pay | Admitting: Family Medicine

## 2021-11-27 ENCOUNTER — Emergency Department (HOSPITAL_BASED_OUTPATIENT_CLINIC_OR_DEPARTMENT_OTHER)
Admission: EM | Admit: 2021-11-27 | Discharge: 2021-11-27 | Disposition: A | Discharge status: Home / Self Care | Payer: 59 | Attending: Emergency Medicine | Admitting: Emergency Medicine

## 2021-11-27 ENCOUNTER — Encounter (HOSPITAL_BASED_OUTPATIENT_CLINIC_OR_DEPARTMENT_OTHER): Payer: Self-pay

## 2021-11-27 ENCOUNTER — Other Ambulatory Visit: Payer: Self-pay

## 2021-11-27 DIAGNOSIS — N201 Calculus of ureter: Secondary | ICD-10-CM | POA: Diagnosis not present

## 2021-11-27 DIAGNOSIS — R935 Abnormal findings on diagnostic imaging of other abdominal regions, including retroperitoneum: Secondary | ICD-10-CM

## 2021-11-27 DIAGNOSIS — R109 Unspecified abdominal pain: Secondary | ICD-10-CM | POA: Diagnosis present

## 2021-11-27 LAB — URINALYSIS, ROUTINE W REFLEX MICROSCOPIC
Bilirubin Urine: NEGATIVE
Glucose, UA: NEGATIVE mg/dL
Ketones, ur: NEGATIVE mg/dL
Leukocytes,Ua: NEGATIVE
Nitrite: NEGATIVE
Protein, ur: NEGATIVE mg/dL
Specific Gravity, Urine: 1.01 (ref 1.005–1.030)
pH: 7 (ref 5.0–8.0)

## 2021-11-27 LAB — CBC
HCT: 31.9 % — ABNORMAL LOW (ref 36.0–46.0)
Hemoglobin: 10.6 g/dL — ABNORMAL LOW (ref 12.0–15.0)
MCH: 26.8 pg (ref 26.0–34.0)
MCHC: 33.2 g/dL (ref 30.0–36.0)
MCV: 80.6 fL (ref 80.0–100.0)
Platelets: 353 10*3/uL (ref 150–400)
RBC: 3.96 MIL/uL (ref 3.87–5.11)
RDW: 14.4 % (ref 11.5–15.5)
WBC: 9.2 10*3/uL (ref 4.0–10.5)
nRBC: 0 % (ref 0.0–0.2)

## 2021-11-27 LAB — PREGNANCY, URINE: Preg Test, Ur: NEGATIVE

## 2021-11-27 LAB — BASIC METABOLIC PANEL
Anion gap: 7 (ref 5–15)
BUN: 21 mg/dL — ABNORMAL HIGH (ref 6–20)
CO2: 26 mmol/L (ref 22–32)
Calcium: 10.5 mg/dL — ABNORMAL HIGH (ref 8.9–10.3)
Chloride: 97 mmol/L — ABNORMAL LOW (ref 98–111)
Creatinine, Ser: 1.03 mg/dL — ABNORMAL HIGH (ref 0.44–1.00)
GFR, Estimated: >60 mL/min (ref >60)
Glucose, Bld: 296 mg/dL — ABNORMAL HIGH (ref 70–99)
Potassium: 4.5 mmol/L (ref 3.5–5.1)
Sodium: 130 mmol/L — ABNORMAL LOW (ref 135–145)

## 2021-11-27 LAB — URINALYSIS, MICROSCOPIC (REFLEX)

## 2021-11-27 MED ORDER — OXYCODONE HCL 5 MG PO TABS: 5.0000 mg | ORAL_TABLET | Freq: Four times a day (QID) | ORAL | 0 refills | Status: AC | PRN

## 2021-11-27 MED ORDER — SODIUM CHLORIDE 0.9 % IV BOLUS
1000.0000 mL | Freq: Once | INTRAVENOUS | Status: AC
  Administered 2021-11-27: 1000 mL via INTRAVENOUS

## 2021-11-27 MED ORDER — MORPHINE SULFATE (PF) 4 MG/ML IV SOLN
4.0000 mg | Freq: Once | INTRAVENOUS | Status: AC
  Administered 2021-11-27: 4 mg via INTRAVENOUS
  Filled 2021-11-27: qty 1

## 2021-11-27 MED ORDER — KETOROLAC TROMETHAMINE 15 MG/ML IJ SOLN
15.0000 mg | Freq: Once | INTRAMUSCULAR | Status: AC
  Administered 2021-11-27: 15 mg via INTRAVENOUS
  Filled 2021-11-27: qty 1

## 2021-11-27 MED ORDER — ONDANSETRON HCL 4 MG/2ML IJ SOLN
4.0000 mg | Freq: Once | INTRAMUSCULAR | Status: AC
  Administered 2021-11-27: 4 mg via INTRAVENOUS
  Filled 2021-11-27: qty 2

## 2021-11-27 MED ORDER — IBUPROFEN 600 MG PO TABS: 600.0000 mg | ORAL_TABLET | Freq: Four times a day (QID) | ORAL | 0 refills | Status: AC | PRN

## 2021-11-27 MED ORDER — OXYCODONE HCL 5 MG PO TABS
5.0000 mg | ORAL_TABLET | Freq: Once | ORAL | Status: AC
  Administered 2021-11-27: 5 mg via ORAL
  Filled 2021-11-27: qty 1

## 2021-11-27 MED ORDER — TAMSULOSIN HCL 0.4 MG PO CAPS: 0.4000 mg | ORAL_CAPSULE | Freq: Every day | ORAL | 0 refills | Status: AC

## 2021-11-27 NOTE — ED Notes (Signed)
Patient transported to CT 

## 2021-11-27 NOTE — ED Triage Notes (Signed)
Pt reports right flank pain that radiates to right abdomen  since this am. Pain in lower abdomen 2 days prior. Reports small amounts of urine

## 2021-11-27 NOTE — ED Notes (Signed)
Pt transported to CT ?

## 2021-11-27 NOTE — Telephone Encounter (Signed)
Pt's daughter states pt is having abdominal pain she's unsure if pain is severe. Transferred pt's daughter to triage for symptoms

## 2021-11-27 NOTE — Telephone Encounter (Signed)
Triage nurse call and stated that pt and daughter were resistant to go to ER as they instructed.  Pt is having RLQ pain radiating to back.  Advised to transfer.  Call was transferred and I advised daughter Larena Sox that her mom really needs to go to ER now because it could be a few things that could be wrong and needs to be scanned now to make sure that nothing burst/inflamed.  She will take pt to [place] ER.

## 2021-11-27 NOTE — ED Provider Notes (Signed)
[facility] EMERGENCY DEPARTMENT Provider Note   CSN: [id number] Arrival date & time: 11/27/21  1614     History  Chief Complaint  Patient presents with   Flank Pain    [name] is a 51 y.o. female.  Patient is a 51 year old female presenting for complaints of flank pain.  Patient midst to right-sided flank pain with radiation to the right abdomen.  Denies any hematuria, dysuria, increased frequency, urgency.  No prior history of stones.  No recent urinary tract infections.  Denies any fevers, chills, nausea, vomiting.  Denies decreased urine or difficulty urinating.  The history is provided by the patient. No language interpreter was used.  Flank Pain Associated symptoms include abdominal pain. Pertinent negatives include no chest pain and no shortness of breath.      Home Medications Prior to Admission medications   Medication Sig Start Date End Date Taking? Authorizing Provider  ibuprofen (ADVIL) 600 MG tablet Take 1 tablet (600 mg total) by mouth every 6 (six) hours as needed. [id number]  Yes Campbell Stall P, DO  oxyCODONE (ROXICODONE) 5 MG immediate release tablet Take 1 tablet (5 mg total) by mouth every 6 (six) hours as needed for severe pain. [id number]  Yes Campbell Stall P, DO  tamsulosin (FLOMAX) 0.4 MG CAPS capsule Take 1 capsule (0.4 mg total) by mouth daily. [id number]  Yes Campbell Stall P, DO  Continuous Blood Gluc Receiver (FREESTYLE LIBRE 2 READER) DEVI 1 Device by Does not apply route as directed. 08/22/21   Shamleffer, Melanie Crazier, MD  Continuous Blood Gluc Sensor (FREESTYLE LIBRE 2 SENSOR) MISC 1 Device by Does not apply route every 14 (fourteen) days. 08/22/21   Shamleffer, Melanie Crazier, MD  Ferrous Sulfate (IRON PO) Take 1 tablet by mouth daily. Patient not taking: Reported on 10/16/2021    [provider]  glucose blood (CONTOUR NEXT TEST) test strip 4 (four) times daily. 06/05/20   [provider]  insulin aspart (NOVOLOG FLEXPEN) 100  UNIT/ML FlexPen Max daily 100 units Patient taking differently: Max daily 100 units; sliding scale 08/22/21   Shamleffer, Melanie Crazier, MD  Insulin Pen Needle (PEN NEEDLES) 31G X 5 MM MISC Use to check blood sugar 4 times daily 11/26/21   Shamleffer, Melanie Crazier, MD  Insulin Pen Needle 32G X 4 MM MISC 1 Device by Does not apply route in the morning, at noon, in the evening, and at bedtime. 08/22/21   Shamleffer, Melanie Crazier, MD  lisinopril (ZESTRIL) 20 MG tablet Take 1 tablet (20 mg total) by mouth daily. 08/26/21   Shamleffer, Melanie Crazier, MD  omeprazole (PRILOSEC) 20 MG capsule Take 20 mg by mouth daily.    [provider]  ondansetron (ZOFRAN) 4 MG tablet Take 1 tablet (4 mg total) by mouth every 6 (six) hours as needed for nausea. Patient not taking: Reported on 10/16/2021 10/07/21   [name], MD  TOUJEO SOLOSTAR 300 UNIT/ML Solostar Pen Inject 46 Units into the skin daily. Patient taking differently: Inject 60 Units into the skin daily. 08/22/21   Shamleffer, Melanie Crazier, MD      Allergies    Patient has no known allergies.    Review of Systems   Review of Systems  Constitutional:  Negative for chills and fever.  HENT:  Negative for ear pain and sore throat.   Eyes:  Negative for pain and visual disturbance.  Respiratory:  Negative for cough and shortness of breath.   Cardiovascular:  Negative for chest pain  and palpitations.  Gastrointestinal:  Positive for abdominal pain. Negative for nausea and vomiting.  Genitourinary:  Positive for flank pain. Negative for dysuria and hematuria.  Musculoskeletal:  Negative for arthralgias and back pain.  Skin:  Negative for color change and rash.  Neurological:  Negative for seizures and syncope.  All other systems reviewed and are negative.  Physical Exam Updated Vital Signs BP 129/79   Pulse 72   Temp 98.7 F (37.1 C) (Oral)   Resp 16   Ht 5\' 4"  (1.626 m)   Wt 54.4 kg   SpO2 97%   BMI 20.60 kg/m   Physical Exam Vitals and nursing note reviewed.  Constitutional:      General: She is not in acute distress.    Appearance: She is well-developed.  HENT:     Head: Normocephalic and atraumatic.  Eyes:     Conjunctiva/sclera: Conjunctivae normal.  Cardiovascular:     Rate and Rhythm: Normal rate and regular rhythm.     Heart sounds: No murmur heard. Pulmonary:     Effort: Pulmonary effort is normal. No respiratory distress.     Breath sounds: Normal breath sounds.  Abdominal:     Palpations: Abdomen is soft.     Tenderness: There is no abdominal tenderness.  Musculoskeletal:        General: No swelling.     Cervical back: Neck supple.  Skin:    General: Skin is warm and dry.     Capillary Refill: Capillary refill takes less than 2 seconds.  Neurological:     Mental Status: She is alert.  Psychiatric:        Mood and Affect: Mood normal.    ED Results / Procedures / Treatments   Labs (all labs ordered are listed, but only abnormal results are displayed) Labs Reviewed  URINALYSIS, ROUTINE W REFLEX MICROSCOPIC - Abnormal; Notable for the following components:      Result Value   Hgb urine dipstick TRACE (*)    All other components within normal limits  BASIC METABOLIC PANEL - Abnormal; Notable for the following components:   Sodium 130 (*)    Chloride 97 (*)    Glucose, Bld 296 (*)    BUN 21 (*)    Creatinine, Ser 1.03 (*)    Calcium 10.5 (*)    All other components within normal limits  CBC - Abnormal; Notable for the following components:   Hemoglobin 10.6 (*)    HCT 31.9 (*)    All other components within normal limits  URINALYSIS, MICROSCOPIC (REFLEX) - Abnormal; Notable for the following components:   Bacteria, UA RARE (*)    All other components within normal limits  PREGNANCY, URINE    EKG None  Radiology CT Renal Stone Study  Result Date: 11/27/2021 CLINICAL DATA:  Flank pain. Kidney stones suspected. Right flank pain radiating to the right abdomen  since this morning. Pain in lower abdomen 2 days prior. EXAM: CT ABDOMEN AND PELVIS WITHOUT CONTRAST TECHNIQUE: Multidetector CT imaging of the abdomen and pelvis was performed following the standard protocol without IV contrast. RADIATION DOSE REDUCTION: This exam was performed according to the departmental dose-optimization program which includes automated exposure control, adjustment of the mA and/or kV according to patient size and/or use of iterative reconstruction technique. COMPARISON:  [facility] 10/07/2021; CT abdomen and pelvis 10/02/2021 FINDINGS: Lower chest: There again is mild interstitial thickening within the partially visualized right middle lobe and lower lobe with mild interstitial scarring adjacent to the  right major fissure. No pleural effusion. Hepatobiliary: Smooth liver contours. No gross liver lesion. The gallbladder is grossly unremarkable. Pancreas: No gross abnormality. Spleen: Diffuse mild heterogeneity corresponding to the numerous hypodense lesions better seen on prior contrast CT. There are again enlarged lymph nodes including small splenule again seen medial to the splenic hilum. Adrenals/Urinary Tract: Normal adrenals. There is a new 3 mm stone within the distal right ureter (axial series 3, image 71) with moderate upstream ureterectasis and mild-to-moderate right hydronephrosis. The right kidney is mildly edematous and there is minimal right perinephric fat stranding. No left hydronephrosis. No stone is seen within either kidney. Within the limitation of lack of IV contrast, no contour deforming renal mass is seen. Stomach/Bowel: Normal appendix. Mild fecal material within the distal ileum suggesting chronic slow bowel transit. No dilated loops of bowel are seen to indicate bowel obstruction. Vascular/Lymphatic: No abdominal aortic aneurysm. Minimal atherosclerotic calcifications. Previously on the contrasted CT multiple mildly enlarged right upper quadrant lymph nodes are seen including  aortocaval 1.2 cm and periportal 1.2 cm lymph nodes. These appear stable to mildly decreased in size from prior, however evaluation of the current noncontrast study is limited. Subcentimeter short axis para-aortic lymph nodes are again seen. Left external iliac chain 1.1 cm short axis (axial series 3, image 65) appears similar to prior. No mesenteric pathologically enlarged lymph nodes are seen. There are again enlarged bilateral inguinal lymph nodes. Reference left inguinal 1.1 cm short axis lymph node is unchanged (axial image 87). A partially visualized more inferior left 1.5 cm and partially visualized more inferior right 1.3 cm short axis lymph nodes were not previously imaged due to a shorter scan length previously. A more superior right inguinal 9 mm short axis lymph node (axial image 85) is similar to prior. Reproductive: The uterus is present. No gross adnexal abnormality is identified. There is mild pelvic vascular congestion as better seen on prior contrast CT. Other: There is mild interval decrease in size of the previously seen left anterior abdominal wall subcutaneous fat superficial fluid density now measuring up to 2.5 x 1.1 x 3.8 cm compared to 2.8 x 1.5 x 4.2 cm previously. Mild surrounding inflammatory fat stranding. Again this is nonspecific but may represent a prior injection site. Resolution of the prior more lateral left anterior abdominal wall subcutaneous fat subcutaneous air likely from prior injection. No free air or free fluid is seen within the abdomen or pelvis. Musculoskeletal: Minimal dextrocurvature centered within the upper lumbar spine. No acute skeletal abnormality. IMPRESSION: Compared to 10/02/2021: 1. There is a 3 mm distal right ureteral stone with moderate upstream right ureterectasis and mild-to-moderate right hydronephrosis. 2. On the prior contrasted CT 10/02/2021 there are innumerable low-density lesions within the spleen and liver. These are less well visualized on the  current noncontrast CT but likely still present. 3. There are again mildly enlarged retroperitoneal and pelvic lymph node. No bulky or encasing lymphadenopathy. 4. Minimal interval decrease in size of left anterior abdominal wall superficial subcutaneous fat density that may represent a prior injection site. Electronically Signed   By: Yvonne Kendall M.D.   On: 11/27/2021 17:38    Procedures Procedures    Medications Ordered in ED Medications  oxyCODONE (Oxy IR/ROXICODONE) immediate release tablet 5 mg (5 mg Oral Given 11/27/21 1648)  sodium chloride 0.9 % bolus 1,000 mL (0 mLs Intravenous Stopped 11/27/21 1933)  ketorolac (TORADOL) 15 MG/ML injection 15 mg (15 mg Intravenous Given 11/27/21 1838)  morphine (PF) 4 MG/ML injection  4 mg (4 mg Intravenous Given 11/27/21 1839)  ondansetron (ZOFRAN) injection 4 mg (4 mg Intravenous Given 11/27/21 1838)    ED Course/ Medical Decision Making/ A&P                           Medical Decision Making Amount and/or Complexity of Data Reviewed Labs: ordered. Radiology: ordered.  Risk Prescription drug management.   32:53 PM 51 year old female presenting for complaints of flank pain.  Patient is alert and oriented x3, no acute distress, afebrile, stable vital signs.  Physical exam demonstrate no reproducible tenderness.  Laboratory studies stable.  CT demonstrates 3 mm distal right ureteral stone with moderate upstream and hydronephrosis.  UA demonstrates no urinary tract infection.  Microscopic blood present.  Patient prescribed medication for pain control and Flomax with recommendations for close follow-up with urology.  Patient aware of the other findings from the CT scan however not given to patient with recommendations for close follow-up with PCP for further management.  Patient in no distress and overall condition improved here in the ED. Detailed discussions were had with the patient regarding current findings, and need for close f/u with PCP or on  call doctor. The patient has been instructed to return immediately if the symptoms worsen in any way for re-evaluation. Patient verbalized understanding and is in agreement with current care plan. All questions answered prior to discharge.        Final Clinical Impression(s) / ED Diagnoses Final diagnoses:  Ureterolithiasis  Abnormal CT of the abdomen    Rx / DC Orders ED Discharge Orders          Ordered    tamsulosin (FLOMAX) 0.4 MG CAPS capsule  Daily        11/27/21 1931    oxyCODONE (ROXICODONE) 5 MG immediate release tablet  Every 6 hours PRN        11/27/21 1931    ibuprofen (ADVIL) 600 MG tablet  Every 6 hours PRN        11/27/21 1931              Lianne Cure, DO [id number]

## 2021-11-27 NOTE — ED Provider Notes (Incomplete)
[facility] EMERGENCY DEPARTMENT Provider Note   CSN: [id number] Arrival date & time: 11/27/21  1614     History {Add pertinent medical, surgical, social history, OB history to HPI:1} Chief Complaint  Patient presents with   Flank Pain    [name] is a 51 y.o. female.   Flank Pain      Home Medications Prior to Admission medications   Medication Sig Start Date End Date Taking? Authorizing Provider  Continuous Blood Gluc Receiver (FREESTYLE LIBRE 2 READER) DEVI 1 Device by Does not apply route as directed. 08/22/21   Shamleffer, Melanie Crazier, MD  Continuous Blood Gluc Sensor (FREESTYLE LIBRE 2 SENSOR) MISC 1 Device by Does not apply route every 14 (fourteen) days. 08/22/21   Shamleffer, Melanie Crazier, MD  Ferrous Sulfate (IRON PO) Take 1 tablet by mouth daily. Patient not taking: Reported on 10/16/2021    [provider]  glucose blood (CONTOUR NEXT TEST) test strip 4 (four) times daily. 06/05/20   [provider]  insulin aspart (NOVOLOG FLEXPEN) 100 UNIT/ML FlexPen Max daily 100 units Patient taking differently: Max daily 100 units; sliding scale 08/22/21   Shamleffer, Melanie Crazier, MD  Insulin Pen Needle (PEN NEEDLES) 31G X 5 MM MISC Use to check blood sugar 4 times daily 11/26/21   Shamleffer, Melanie Crazier, MD  Insulin Pen Needle 32G X 4 MM MISC 1 Device by Does not apply route in the morning, at noon, in the evening, and at bedtime. 08/22/21   Shamleffer, Melanie Crazier, MD  lisinopril (ZESTRIL) 20 MG tablet Take 1 tablet (20 mg total) by mouth daily. 08/26/21   Shamleffer, Melanie Crazier, MD  omeprazole (PRILOSEC) 20 MG capsule Take 20 mg by mouth daily.    [provider]  ondansetron (ZOFRAN) 4 MG tablet Take 1 tablet (4 mg total) by mouth every 6 (six) hours as needed for nausea. Patient not taking: Reported on 10/16/2021 10/07/21   [name] Hazel, MD  TOUJEO SOLOSTAR 300 UNIT/ML Solostar Pen Inject 46 Units into the skin  daily. Patient taking differently: Inject 60 Units into the skin daily. 08/22/21   Shamleffer, Melanie Crazier, MD      Allergies    Patient has no known allergies.    Review of Systems   Review of Systems  Genitourinary:  Positive for flank pain.   Physical Exam Updated Vital Signs BP 115/68 (BP Location: Left Arm)   Pulse 90   Temp 98.7 F (37.1 C) (Oral)   Resp 18   Ht 5\' 4"  (1.626 m)   Wt 54.4 kg   SpO2 100%   BMI 20.60 kg/m  Physical Exam  ED Results / Procedures / Treatments   Labs (all labs ordered are listed, but only abnormal results are displayed) Labs Reviewed  URINALYSIS, ROUTINE W REFLEX MICROSCOPIC - Abnormal; Notable for the following components:      Result Value   Hgb urine dipstick TRACE (*)    All other components within normal limits  BASIC METABOLIC PANEL - Abnormal; Notable for the following components:   Sodium 130 (*)    Chloride 97 (*)    Glucose, Bld 296 (*)    BUN 21 (*)    Creatinine, Ser 1.03 (*)    Calcium 10.5 (*)    All other components within normal limits  CBC - Abnormal; Notable for the following components:   Hemoglobin 10.6 (*)    HCT 31.9 (*)    All other components within normal limits  URINALYSIS, MICROSCOPIC (REFLEX) - Abnormal; Notable for the following components:   Bacteria, UA RARE (*)    All other components within normal limits  PREGNANCY, URINE    EKG None  Radiology CT Renal Stone Study  Result Date: 11/27/2021 CLINICAL DATA:  Flank pain. Kidney stones suspected. Right flank pain radiating to the right abdomen since this morning. Pain in lower abdomen 2 days prior. EXAM: CT ABDOMEN AND PELVIS WITHOUT CONTRAST TECHNIQUE: Multidetector CT imaging of the abdomen and pelvis was performed following the standard protocol without IV contrast. RADIATION DOSE REDUCTION: This exam was performed according to the departmental dose-optimization program which includes automated exposure control, adjustment of the mA and/or kV  according to patient size and/or use of iterative reconstruction technique. COMPARISON:  KUB 10/07/2021; CT abdomen and pelvis 10/02/2021 FINDINGS: Lower chest: There again is mild interstitial thickening within the partially visualized right middle lobe and lower lobe with mild interstitial scarring adjacent to the right major fissure. No pleural effusion. Hepatobiliary: Smooth liver contours. No gross liver lesion. The gallbladder is grossly unremarkable. Pancreas: No gross abnormality. Spleen: Diffuse mild heterogeneity corresponding to the numerous hypodense lesions better seen on prior contrast CT. There are again enlarged lymph nodes including small splenule again seen medial to the splenic hilum. Adrenals/Urinary Tract: Normal adrenals. There is a new 3 mm stone within the distal right ureter (axial series 3, image 71) with moderate upstream ureterectasis and mild-to-moderate right hydronephrosis. The right kidney is mildly edematous and there is minimal right perinephric fat stranding. No left hydronephrosis. No stone is seen within either kidney. Within the limitation of lack of IV contrast, no contour deforming renal mass is seen. Stomach/Bowel: Normal appendix. Mild fecal material within the distal ileum suggesting chronic slow bowel transit. No dilated loops of bowel are seen to indicate bowel obstruction. Vascular/Lymphatic: No abdominal aortic aneurysm. Minimal atherosclerotic calcifications. Previously on the contrasted CT multiple mildly enlarged right upper quadrant lymph nodes are seen including aortocaval 1.2 cm and periportal 1.2 cm lymph nodes. These appear stable to mildly decreased in size from prior, however evaluation of the current noncontrast study is limited. Subcentimeter short axis para-aortic lymph nodes are again seen. Left external iliac chain 1.1 cm short axis (axial series 3, image 65) appears similar to prior. No mesenteric pathologically enlarged lymph nodes are seen. There are  again enlarged bilateral inguinal lymph nodes. Reference left inguinal 1.1 cm short axis lymph node is unchanged (axial image 87). A partially visualized more inferior left 1.5 cm and partially visualized more inferior right 1.3 cm short axis lymph nodes were not previously imaged due to a shorter scan length previously. A more superior right inguinal 9 mm short axis lymph node (axial image 85) is similar to prior. Reproductive: The uterus is present. No gross adnexal abnormality is identified. There is mild pelvic vascular congestion as better seen on prior contrast CT. Other: There is mild interval decrease in size of the previously seen left anterior abdominal wall subcutaneous fat superficial fluid density now measuring up to 2.5 x 1.1 x 3.8 cm compared to 2.8 x 1.5 x 4.2 cm previously. Mild surrounding inflammatory fat stranding. Again this is nonspecific but may represent a prior injection site. Resolution of the prior more lateral left anterior abdominal wall subcutaneous fat subcutaneous air likely from prior injection. No free air or free fluid is seen within the abdomen or pelvis. Musculoskeletal: Minimal dextrocurvature centered within the upper lumbar spine. No acute skeletal abnormality. IMPRESSION: Compared to 10/02/2021: 1.  There is a 3 mm distal right ureteral stone with moderate upstream right ureterectasis and mild-to-moderate right hydronephrosis. 2. On the prior contrasted CT 10/02/2021 there are innumerable low-density lesions within the spleen and liver. These are less well visualized on the current noncontrast CT but likely still present. 3. There are again mildly enlarged retroperitoneal and pelvic lymph node. No bulky or encasing lymphadenopathy. 4. Minimal interval decrease in size of left anterior abdominal wall superficial subcutaneous fat density that may represent a prior injection site. Electronically Signed   By: Yvonne Kendall M.D.   On: 11/27/2021 17:38    Procedures Procedures   {Document cardiac monitor, telemetry assessment procedure when appropriate:1}  Medications Ordered in ED Medications  oxyCODONE (Oxy IR/ROXICODONE) immediate release tablet 5 mg (5 mg Oral Given 11/27/21 1648)    ED Course/ Medical Decision Making/ A&P                           Medical Decision Making Amount and/or Complexity of Data Reviewed Labs: ordered. Radiology: ordered.  Risk Prescription drug management.   ***  {Document critical care time when appropriate:1} {Document review of labs and clinical decision tools ie heart score, Chads2Vasc2 etc:1}  {Document your independent review of radiology images, and any outside records:1} {Document your discussion with family members, caretakers, and with consultants:1} {Document social determinants of health affecting pt's care:1} {Document your decision making why or why not admission, treatments were needed:1} Final Clinical Impression(s) / ED Diagnoses Final diagnoses:  None    Rx / DC Orders ED Discharge Orders     None

## 2021-11-27 NOTE — ED Notes (Signed)
Pt care taken, waiting for fluids to finish, no complaints at this time.

## 2021-12-03 ENCOUNTER — Other Ambulatory Visit: Payer: Self-pay | Admitting: *Deleted

## 2021-12-03 DIAGNOSIS — D8689 Sarcoidosis of other sites: Secondary | ICD-10-CM

## 2021-12-03 NOTE — Progress Notes (Signed)
New referral placed to [facility] per pt request.

## 2021-12-03 NOTE — Telephone Encounter (Signed)
Pt's daughter stated [facility] received a referral with wrong name attached. She stated it has to say [facility]. Please advise.

## 2021-12-09 ENCOUNTER — Telehealth: Payer: Self-pay

## 2021-12-09 DIAGNOSIS — D8689 Sarcoidosis of other sites: Secondary | ICD-10-CM

## 2021-12-09 NOTE — Telephone Encounter (Signed)
Needs rheumatology referral resent- needs to be for Dr. Yehuda Budd.

## 2022-03-27 ENCOUNTER — Ambulatory Visit: Payer: Self-pay | Admitting: Internal Medicine

## 2022-03-27 ENCOUNTER — Telehealth: Payer: Self-pay | Admitting: Internal Medicine

## 2022-03-27 DIAGNOSIS — E1065 Type 1 diabetes mellitus with hyperglycemia: Secondary | ICD-10-CM

## 2022-03-27 MED ORDER — TOUJEO SOLOSTAR 300 UNIT/ML ~~LOC~~ SOPN: 46.0000 [IU] | PEN_INJECTOR | Freq: Every day | SUBCUTANEOUS | 3 refills | Status: DC

## 2022-03-27 NOTE — Telephone Encounter (Signed)
MEDICATION: TOUJEO SOLOSTAR 300 UNIT/ML Solostar Pen  PHARMACY:  [id number] - [place], [place] - [address] AT [place] (Ph: [phone])  HAS THE PATIENT CONTACTED THEIR PHARMACY?  Yes  IS THIS A 90 DAY SUPPLY Yes   IS PATIENT OUT OF MEDICATION: Yes  IF NOT; HOW MUCH IS LEFT:   LAST APPOINTMENT DATE: @9 /28/2023  NEXT APPOINTMENT DATE:@10 /10/2021  DO WE HAVE YOUR PERMISSION TO LEAVE A DETAILED MESSAGE?:YES  OTHER COMMENTS:    **Let patient know to contact pharmacy at the end of the day to make sure medication is ready. **  ** Please notify patient to allow 48-72 hours to process**  **Encourage patient to contact the pharmacy for refills or they can request refills through [facility]**

## 2022-03-27 NOTE — Progress Notes (Deleted)
Name: [name]  MRN/ DOB: [id number], 10/10/1970   Age/ Sex: 51 y.o., female    PCP: Clayborne Dana, NP   Reason for Endocrinology Evaluation: Type 1 Diabetes Mellitus     Date of Initial Endocrinology Visit: 08/22/2021    PATIENT IDENTIFIER: [name] is a 51 y.o. female with a past medical history of T1DM. The patient presented for initial endocrinology clinic visit on 08/22/2021 for consultative assistance with her diabetes management.    HPI: Ms. Lahue was    Diagnosed with DM in 2010  Hemoglobin A1c has ranged from 8.5% in 2021, peaking at 10.7% in 2017. Transferred care from [facility], was last seen there in 2021  On her initial visit to our clinic she had an A1c of 8.2%, reduced Toujeo, and continued prandial dose of insulin, and provided her with a correction scale to use for NovoLog  Due to complaints of burning of the feet disrupting her sleep I attempted to increase her gabapentin but she called back later asking to return to the original dose of 100 mg because the 300 mg dose was too strong   SUBJECTIVE:   During the last visit (08/22/2021): A1c 8.2%  Today (03/27/22): Ms. Kwan is here for follow-up on diabetes management.  She checks her blood sugars *** times daily. The patient has *** had hypoglycemic episodes since the last clinic visit, which typically occur *** x / - most often occuring ***. The patient is *** symptomatic with these episodes, with symptoms of {symptoms; hypoglycemia:[id number]}.    Patient presented to the ED in April 2023 with nausea and vomiting, she was noted to have BG reading of 297 mg/DL but was also noted to have hypercalcemia with low PTH and a vitamin D of 92, it appears that she was taking once weekly vitamin D but more frequent.  CT abdomen showed innumerable liver lesions and splenic lesions she also tested positive for HIV but HIV-1 and HIV-to confirm negative.  Liver biopsy confirmed no malignancy, consistent with  sarcoidosis, she was referred to rheumatology    HOME DIABETES REGIMEN: Toujeo  55 units daily in AM  Novolog 10 units with each meal  Correction factor: NovoLog(BG -130/30)    Statin: no ACE-I/ARB: yes  METER DOWNLOAD SUMMARY: Did not bring  DIABETIC COMPLICATIONS: Microvascular complications:  Neuropathy Denies: CKD, retinopathy  Last eye exam: Completed 2021  Macrovascular complications:   Denies: CAD, PVD, CVA   PAST HISTORY: Past Medical History:  Past Medical History:  Diagnosis Date   Type 2 diabetes mellitus (HCC)    Past Surgical History:  Past Surgical History:  Procedure Laterality Date   CESAREAN SECTION  03/05/2012   Procedure: CESAREAN SECTION;  Surgeon: Fortino Sic, MD;  Location: [facility];  Service: Obstetrics;  Laterality: N/A;    Social History:  reports that she has never smoked. She does not have any smokeless tobacco history on file. She reports that she does not drink alcohol and does not use drugs. Family History: No family history on file.   HOME MEDICATIONS: Allergies as of 03/27/2022   No Known Allergies      Medication List        Accurate as of March 27, 2022  7:02 AM. If you have any questions, ask your nurse or doctor.          Contour Next Test test strip Generic drug: glucose blood 4 (four) times daily.   FreeStyle [employer] 2 Reader [employer] 1 Device by  Does not apply route as directed.   FreeStyle Libre 2 Sensor Misc 1 Device by Does not apply route every 14 (fourteen) days.   ibuprofen 600 MG tablet Commonly known as: ADVIL Take 1 tablet (600 mg total) by mouth every 6 (six) hours as needed.   Insulin Pen Needle 32G X 4 MM Misc 1 Device by Does not apply route in the morning, at noon, in the evening, and at bedtime.   Pen Needles 31G X 5 MM Misc Use to check blood sugar 4 times daily   IRON PO Take 1 tablet by mouth daily.   lisinopril 20 MG tablet Commonly known as: ZESTRIL Take 1 tablet (20 mg total)  by mouth daily.   NovoLOG FlexPen 100 UNIT/ML FlexPen Generic drug: insulin aspart Max daily 100 units What changed: additional instructions   omeprazole 20 MG capsule Commonly known as: PRILOSEC Take 20 mg by mouth daily.   ondansetron 4 MG tablet Commonly known as: ZOFRAN Take 1 tablet (4 mg total) by mouth every 6 (six) hours as needed for nausea.   oxyCODONE 5 MG immediate release tablet Commonly known as: Roxicodone Take 1 tablet (5 mg total) by mouth every 6 (six) hours as needed for severe pain.   tamsulosin 0.4 MG Caps capsule Commonly known as: Flomax Take 1 capsule (0.4 mg total) by mouth daily.   Toujeo SoloStar 300 UNIT/ML Solostar Pen Generic drug: insulin glargine (1 Unit Dial) Inject 46 Units into the skin daily. What changed: how much to take         ALLERGIES: No Known Allergies   REVIEW OF SYSTEMS: A comprehensive ROS was conducted with the patient and is negative except as per HPI and below:  ROS    OBJECTIVE:   VITAL SIGNS: There were no vitals taken for this visit.   PHYSICAL EXAM:  General: Pt appears well and is in NAD  Hydration: Well-hydrated with moist mucous membranes and good skin turgor  HEENT: Head: Unremarkable with good dentition. Oropharynx clear without exudate.  Eyes: External eye exam normal without stare, lid lag or exophthalmos.  EOM intact.  PERRL.  Neck: General: Supple without adenopathy or carotid bruits. Thyroid: Thyroid size normal.  No goiter or nodules appreciated. No thyroid bruit.  Lungs: Clear with good BS bilat with no rales, rhonchi, or wheezes  Heart: RRR with normal S1 and S2 and no gallops; no murmurs; no rub  Abdomen: Normoactive bowel sounds, soft, nontender, without masses or organomegaly palpable  Extremities:  Lower extremities - No pretibial edema. No lesions.  Skin: Normal texture and temperature to palpation. No rash noted. No Acanthosis nigricans/skin tags. No lipohypertrophy.  Neuro: MS is good  with appropriate affect, pt is alert and Ox3    DM foot exam: 08/22/2021  The skin of the feet is intact without sores or ulcerations. The pedal pulses are 2+ on right and 2+ on left. The sensation is intact to a screening 5.07, 10 gram monofilament bilaterally   DATA REVIEWED:   Latest Reference Range & Units 08/22/21 09:25  Sodium 135 - 145 mEq/L 135  Potassium 3.5 - 5.1 mEq/L 4.6  Chloride 96 - 112 mEq/L 100  CO2 19 - 32 mEq/L 30  Glucose 70 - 99 mg/dL 180 (H)  BUN 6 - 23 mg/dL 17  Creatinine 0.40 - 1.20 mg/dL 0.75  Calcium 8.4 - 10.5 mg/dL 9.7  GFR >60.00 mL/min 92.53  Total CHOL/HDL Ratio  4  Cholesterol 0 - 200 mg/dL 167  HDL Cholesterol >39.00 mg/dL 06.23 (L)  LDL (calc) 0 - 99 mg/dL 762 (H)  MICROALB/CREAT RATIO 0.0 - 30.0 mg/g 1.7  NonHDL  128.47  Triglycerides 0.0 - 149.0 mg/dL 831.5  VLDL 0.0 - 17.6 mg/dL 16.0    Latest Reference Range & Units 08/22/21 09:25  VITD 30.00 - 100.00 ng/mL 24.73 (L)     Latest Reference Range & Units 08/22/21 09:25  TSH 0.35 - 5.50 uIU/mL 1.03  T4,Free(Direct) 0.60 - 1.60 ng/dL 7.37    Latest Reference Range & Units 08/22/21 09:25  Creatinine,U mg/dL 10.6  Microalb, Ur 0.0 - 1.9 mg/dL <2.6  MICROALB/CREAT RATIO 0.0 - 30.0 mg/g 1.7     ASSESSMENT / PLAN / RECOMMENDATIONS:   1) Type 1 Diabetes Mellitus, Poorly controlled, With neuropathic complications - Most recent A1c of 8.2 %. Goal A1c < 7.0 %.    Plan: GENERAL: I have discussed with the patient the pathophysiology of diabetes. We went over the natural progression of the disease. I explained the complications associated with diabetes including retinopathy, nephropathy, neuropathy as well as increased risk of cardiovascular disease. We went over the benefit seen with glycemic control.   I explained to the patient that diabetic patients are at higher than normal risk for amputations.  She was unable to understand the concept of carb counting  Due to reported hypoglycemia  around 2 AM , no data available for me I am going to reduce Toujeo No changes to her prandial dose of insulin again due to lack of glucose data She will be provided with a correction scale Freestyle Libre 2's sent to the pharmacy  MEDICATIONS: Decrease Toujeo 46 units daily Continue NovoLog 20 units with each meal Correction factor: NovoLog(BG -130/30)  EDUCATION / INSTRUCTIONS: BG monitoring instructions: Patient is instructed to check her blood sugars 3 times a day, before meals . Call [facility] if: BG persistently < 70  I reviewed the Rule of 15 for the treatment of hypoglycemia in detail with the patient. Literature supplied.   2) Diabetic complications:  Eye: Does not have known diabetic retinopathy.  Neuro/ Feet: Does  have known diabetic peripheral neuropathy. Renal: Patient does not have known baseline CKD. She is  on an ACEI/ARB at present.  3) Dyslipidemia:   -LDL above goal at 102 mg/DL.  We will emphasize importance of low-fat diet and will discuss statin therapy on the next visit   4) vitamin D insufficiency:  -Patient will be advised to start OTC vitamin D3 at 2000 IUs daily  5) Peripheral Neuropathy:  -Patient states she is on small dose of gabapentin and she continues to wake up at night with leg pains -We will increase gabapentin, cautioned against drowsiness    Medication Stop gabapentin 100 mg daily Start gabapentin 300 mg nightly   Follow-up in 3 months  Signed electronically by: Lyndle Herrlich, MD  [facility]  [facility] [address] [place] [place], [place] [address] Phone: [phone] FAX: [phone]   CC: Clayborne Dana, NP [address] [place] [address] Phone: [phone]  Fax: [phone]    Return to Endocrinology clinic as below: Future Appointments  Date Time Provider Department Center  03/27/2022  9:50 AM Kourtney Terriquez, Konrad Dolores, MD  [facility] None  04/02/2022 10:00 AM Icard, Rachel Bo, DO [facility] None

## 2022-03-27 NOTE — Telephone Encounter (Signed)
RX has now been sent to preferred pharmacy 

## 2022-04-02 ENCOUNTER — Encounter: Payer: Self-pay | Admitting: Pulmonary Disease

## 2022-04-02 ENCOUNTER — Ambulatory Visit: Payer: Commercial Managed Care - HMO | Admitting: Pulmonary Disease

## 2022-04-02 VITALS — BP 110/70 | HR 86 | Ht 60.0 in | Wt 125.2 lb

## 2022-04-02 DIAGNOSIS — R0609 Other forms of dyspnea: Secondary | ICD-10-CM | POA: Diagnosis not present

## 2022-04-02 DIAGNOSIS — M2550 Pain in unspecified joint: Secondary | ICD-10-CM

## 2022-04-02 DIAGNOSIS — D8689 Sarcoidosis of other sites: Secondary | ICD-10-CM | POA: Diagnosis not present

## 2022-04-02 LAB — COMPREHENSIVE METABOLIC PANEL
ALT: 20 U/L (ref 0–35)
AST: 18 U/L (ref 0–37)
Albumin: 3.8 g/dL (ref 3.5–5.2)
Alkaline Phosphatase: 103 U/L (ref 39–117)
BUN: 23 mg/dL (ref 6–23)
CO2: 26 mEq/L (ref 19–32)
Calcium: 9.8 mg/dL (ref 8.4–10.5)
Chloride: 99 mEq/L (ref 96–112)
Creatinine, Ser: 0.82 mg/dL (ref 0.40–1.20)
GFR: 82.78 mL/min (ref >60.00)
Glucose, Bld: 272 mg/dL — ABNORMAL HIGH (ref 70–99)
Potassium: 3.8 mEq/L (ref 3.5–5.1)
Sodium: 133 mEq/L — ABNORMAL LOW (ref 135–145)
Total Bilirubin: 0.5 mg/dL (ref 0.2–1.2)
Total Protein: 8.2 g/dL (ref 6.0–8.3)

## 2022-04-02 LAB — CBC WITH DIFFERENTIAL/PLATELET
Basophils Absolute: 0 10*3/uL (ref 0.0–0.1)
Basophils Relative: 0.2 % (ref 0.0–3.0)
Eosinophils Absolute: 0.2 10*3/uL (ref 0.0–0.7)
Eosinophils Relative: 2.4 % (ref 0.0–5.0)
HCT: 34.7 % — ABNORMAL LOW (ref 36.0–46.0)
Hemoglobin: 11.3 g/dL — ABNORMAL LOW (ref 12.0–15.0)
Lymphocytes Relative: 32.3 % (ref 12.0–46.0)
Lymphs Abs: 2.2 10*3/uL (ref 0.7–4.0)
MCHC: 32.6 g/dL (ref 30.0–36.0)
MCV: 82.5 fl (ref 78.0–100.0)
Monocytes Absolute: 0.7 10*3/uL (ref 0.1–1.0)
Monocytes Relative: 10.3 % (ref 3.0–12.0)
Neutro Abs: 3.8 10*3/uL (ref 1.4–7.7)
Neutrophils Relative %: 54.8 % (ref 43.0–77.0)
Platelets: 348 10*3/uL (ref 150.0–400.0)
RBC: 4.2 Mil/uL (ref 3.87–5.11)
RDW: 14.3 % (ref 11.5–15.5)
WBC: 6.9 10*3/uL (ref 4.0–10.5)

## 2022-04-02 NOTE — Progress Notes (Signed)
Synopsis: Referred in October 2023 for pulmonary nodules by Rossie Muskrat, MD  Subjective:   PATIENT ID: [name] GENDER: female DOB: 04/09/1971, MRN: [id number]  Chief Complaint  Patient presents with   Consult    Was told she has Sarcoidosis    This is a 51 year old female, past medical history of type 2 diabetes, C-section.Patient was referred after having CT scan of the chest completed on 10/03/2021.  Patient was found to have diffuse lesions within the liver and spleen.  Also found to have subcentimeter bilateral axillary and a single right paratracheal lymph node within the mediastinum.  She had multiple hypodense lesions concerning for an inflammatory process or lymphoma.  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  She has not had any additional imaging since her hospitalization in April.  She was found to have hypercalcemia by her primary care.  And she was referred here for evaluation of "pulmonary nodules" however there is no documentation of any pulmonary nodules on any imaging.    Past Medical History:  Diagnosis Date   Type 2 diabetes mellitus (HCC)      No family history on file.   Past Surgical History:  Procedure Laterality Date   CESAREAN SECTION  03/05/2012   Procedure: CESAREAN SECTION;  Surgeon: Fortino Sic, MD;  Location: [facility];  Service: Obstetrics;  Laterality: N/A;    Social History   Socioeconomic History   Marital status: Married    Spouse name: Not on file   Number of children: Not on file   Years of education: Not on file   Highest education level: Not on file  Occupational History   Not on file  Tobacco Use   Smoking status: Never   Smokeless tobacco: Not on file  Substance and Sexual Activity   Alcohol use: No   Drug use: No   Sexual activity: Yes  Other Topics Concern   Not on file  Social History Narrative   Not on file   Social Determinants of Health   Financial Resource  Strain: Not on file  Food Insecurity: Not on file  Transportation Needs: Not on file  Physical Activity: Not on file  Stress: Not on file  Social Connections: Not on file  Intimate Partner Violence: Not on file     No Known Allergies   Outpatient Medications Prior to Visit  Medication Sig Dispense Refill   Continuous Blood Gluc Receiver (FREESTYLE LIBRE 2 READER) DEVI 1 Device by Does not apply route as directed. 1 each 0   Continuous Blood Gluc Sensor (FREESTYLE LIBRE 2 SENSOR) MISC 1 Device by Does not apply route every 14 (fourteen) days. 6 each 3   Ferrous Sulfate (IRON PO) Take 1 tablet by mouth daily. (Patient not taking: Reported on 10/16/2021)     glucose blood (CONTOUR NEXT TEST) test strip 4 (four) times daily.     ibuprofen (ADVIL) 600 MG tablet Take 1 tablet (600 mg total) by mouth every 6 (six) hours as needed. 30 tablet 0   insulin aspart (NOVOLOG FLEXPEN) 100 UNIT/ML FlexPen Max daily 100 units (Patient taking differently: Max daily 100 units; sliding scale) 90 mL 3   Insulin Pen Needle (PEN NEEDLES) 31G X 5 MM MISC Use to check blood sugar 4 times daily 400 each 3   Insulin Pen Needle 32G X 4 MM MISC 1 Device by Does not apply route in the morning, at noon, in the evening, and  at bedtime. 400 each 3   lisinopril (ZESTRIL) 20 MG tablet Take 1 tablet (20 mg total) by mouth daily. 90 tablet 3   omeprazole (PRILOSEC) 20 MG capsule Take 20 mg by mouth daily.     ondansetron (ZOFRAN) 4 MG tablet Take 1 tablet (4 mg total) by mouth every 6 (six) hours as needed for nausea. (Patient not taking: Reported on 10/16/2021) 20 tablet 0   oxyCODONE (ROXICODONE) 5 MG immediate release tablet Take 1 tablet (5 mg total) by mouth every 6 (six) hours as needed for severe pain. 12 tablet 0   tamsulosin (FLOMAX) 0.4 MG CAPS capsule Take 1 capsule (0.4 mg total) by mouth daily. 20 capsule 0   TOUJEO SOLOSTAR 300 UNIT/ML Solostar Pen Inject 46 Units into the skin daily. 15 mL 3   No  facility-administered medications prior to visit.    Review of Systems  Constitutional:  Negative for chills, fever, malaise/fatigue and weight loss.  HENT:  Negative for hearing loss, sore throat and tinnitus.   Eyes:  Negative for blurred vision and double vision.  Respiratory:  Positive for shortness of breath. Negative for cough, hemoptysis, sputum production, wheezing and stridor.   Cardiovascular:  Negative for chest pain, palpitations, orthopnea, leg swelling and PND.  Gastrointestinal:  Negative for abdominal pain, constipation, diarrhea, heartburn, nausea and vomiting.  Genitourinary:  Negative for dysuria, hematuria and urgency.  Musculoskeletal:  Positive for joint pain and myalgias.  Skin:  Negative for itching and rash.  Neurological:  Negative for dizziness, tingling, weakness and headaches.  Endo/Heme/Allergies:  Negative for environmental allergies. Does not bruise/bleed easily.  Psychiatric/Behavioral:  Negative for depression. The patient is not nervous/anxious and does not have insomnia.   All other systems reviewed and are negative.    Objective:  Physical Exam Vitals reviewed.  Constitutional:      General: She is not in acute distress.    Appearance: She is well-developed.  HENT:     Head: Normocephalic and atraumatic.  Eyes:     General: No scleral icterus.    Conjunctiva/sclera: Conjunctivae normal.     Pupils: Pupils are equal, round, and reactive to light.  Neck:     Vascular: No JVD.     Trachea: No tracheal deviation.  Cardiovascular:     Rate and Rhythm: Normal rate and regular rhythm.     Heart sounds: Normal heart sounds. No murmur heard. Pulmonary:     Effort: Pulmonary effort is normal. No tachypnea, accessory muscle usage or respiratory distress.     Breath sounds: No stridor. No wheezing, rhonchi or rales.  Abdominal:     General: There is no distension.     Palpations: Abdomen is soft.     Tenderness: There is no abdominal tenderness.   Musculoskeletal:        General: No tenderness.     Cervical back: Neck supple.  Lymphadenopathy:     Cervical: No cervical adenopathy.  Skin:    General: Skin is warm and dry.     Capillary Refill: Capillary refill takes less than 2 seconds.     Findings: No rash.  Neurological:     Mental Status: She is alert and oriented to person, place, and time.  Psychiatric:        Behavior: Behavior normal.      Vitals:   04/02/22 1018  BP: 110/70  Pulse: 86  SpO2: 98%  Weight: 125 lb 3.2 oz (56.8 kg)  Height: 5' (1.524 m)  98% on RA BMI Readings from Last 3 Encounters:  04/02/22 24.45 kg/m  11/27/21 20.60 kg/m  10/24/21 24.30 kg/m   Wt Readings from Last 3 Encounters:  04/02/22 125 lb 3.2 oz (56.8 kg)  11/27/21 120 lb (54.4 kg)  10/24/21 124 lb 6.4 oz (56.4 kg)     CBC    Component Value Date/Time   WBC 9.2 11/27/2021 1709   RBC 3.96 11/27/2021 1709   HGB 10.6 (L) 11/27/2021 1709   HCT 31.9 (L) 11/27/2021 1709   PLT 353 11/27/2021 1709   MCV 80.6 11/27/2021 1709   MCH 26.8 11/27/2021 1709   MCHC 33.2 11/27/2021 1709   RDW 14.4 11/27/2021 1709   LYMPHSABS 1.9 10/24/2021 1152   MONOABS 0.8 10/24/2021 1152   EOSABS 0.3 10/24/2021 1152   BASOSABS 0.0 10/24/2021 1152     Chest Imaging:  CT chest April 2023: No evidence of pulmonary nodules.  Has a small paratracheal node.  Has liver lesions and spleen lesions. The patient's images have been independently reviewed by me.    Pulmonary Functions Testing Results:     No data to display          FeNO:   Pathology:   Echocardiogram:   Heart Catheterization:     Assessment & Plan:     ICD-10-CM   1. Granuloma of liver associated with sarcoidosis  D86.89 Angiotensin converting enzyme    COMPLETE METABOLIC PANEL WITH GFR    CBC w/Diff    CT CHEST HIGH RESOLUTION    Ambulatory referral to Gastroenterology    2. DOE (dyspnea on exertion)  R06.09 CT CHEST HIGH RESOLUTION    Ambulatory referral to  Gastroenterology    3. 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  Based on biopsy she had noncaseating granulomas present.  She is not on any immune suppressing drugs at this time.  Plan: I will check liver function tests, CBC with differential as well as ACE level. Referral placed to gastroenterology. We will obtain a HRCT scan of the chest to see if there is any parenchymal involvement of the lung. We will have her follow-up with Mechele Collin, MD here in our clinic that is developing our sarcoid program. Return to clinic to see Dr. Everardo All in a few weeks, next available 30-minute consult slot.  After her HRCT imaging and blood tests are complete.   Current Outpatient Medications:    Continuous Blood Gluc Receiver (FREESTYLE LIBRE 2 READER) DEVI, 1 Device by Does not apply route as directed., Disp: 1 each, Rfl: 0   Continuous Blood Gluc Sensor (FREESTYLE LIBRE 2 SENSOR) MISC, 1 Device by Does not apply route every 14 (fourteen) days., Disp: 6 each, Rfl: 3   Ferrous Sulfate (IRON PO), Take 1 tablet by mouth daily. (Patient not taking: Reported on 10/16/2021), Disp: , Rfl:    glucose blood (CONTOUR NEXT TEST) test strip, 4 (four) times daily., Disp: , Rfl:    ibuprofen (ADVIL) 600 MG tablet, Take 1 tablet (600 mg total) by mouth every 6 (six) hours as needed., Disp: 30 tablet, Rfl: 0   insulin aspart (NOVOLOG FLEXPEN) 100 UNIT/ML FlexPen, Max daily 100 units (Patient taking differently: Max daily 100 units; sliding scale), Disp: 90 mL, Rfl: 3   Insulin Pen Needle (PEN NEEDLES) 31G X 5 MM MISC, Use to  check blood sugar 4 times daily, Disp: 400 each, Rfl: 3   Insulin Pen Needle 32G X 4 MM MISC, 1 Device by Does not apply route in the morning, at noon, in the evening, and at bedtime., Disp: 400 each, Rfl: 3   lisinopril (ZESTRIL) 20  MG tablet, Take 1 tablet (20 mg total) by mouth daily., Disp: 90 tablet, Rfl: 3   omeprazole (PRILOSEC) 20 MG capsule, Take 20 mg by mouth daily., Disp: , Rfl:    ondansetron (ZOFRAN) 4 MG tablet, Take 1 tablet (4 mg total) by mouth every 6 (six) hours as needed for nausea. (Patient not taking: Reported on 10/16/2021), Disp: 20 tablet, Rfl: 0   oxyCODONE (ROXICODONE) 5 MG immediate release tablet, Take 1 tablet (5 mg total) by mouth every 6 (six) hours as needed for severe pain., Disp: 12 tablet, Rfl: 0   tamsulosin (FLOMAX) 0.4 MG CAPS capsule, Take 1 capsule (0.4 mg total) by mouth daily., Disp: 20 capsule, Rfl: 0   TOUJEO SOLOSTAR 300 UNIT/ML Solostar Pen, Inject 46 Units into the skin daily., Disp: 15 mL, Rfl: 3   Josephine Igo, DO [facility] 04/02/2022 10:46 AM

## 2022-04-02 NOTE — Patient Instructions (Signed)
Thank you for visiting Dr. Valeta Harms at [facility]. Today we recommend the following:  Orders Placed This Encounter  Procedures   CT CHEST HIGH RESOLUTION   Angiotensin converting enzyme   COMPLETE METABOLIC PANEL WITH GFR   CBC w/Diff    Return in about 2 weeks (around 04/16/2022) for w/ Dr. Loanne Drilling, next available 30 mins slot .    Please do your part to reduce the spread of COVID-19.

## 2022-04-03 ENCOUNTER — Ambulatory Visit: Payer: 59 | Admitting: Internal Medicine

## 2022-04-03 NOTE — Progress Notes (Deleted)
Name: [name]  MRN/ DOB: [id number], [birth year]   Age/ Sex: 51 y.o., female    PCP: Clayborne Dana, NP   Reason for Endocrinology Evaluation: Type 1 Diabetes Mellitus     Date of Initial Endocrinology Visit: 08/22/2021    PATIENT IDENTIFIER: [name] is a 51 y.o. female with a past medical history of T1DM, sarcoidosis (2023). The patient presented for initial endocrinology clinic visit on 08/22/2021 for consultative assistance with her diabetes management.    HPI: [name] was    Diagnosed with DM in 2010  Hemoglobin A1c has ranged from 8.5% in 2021, peaking at 10.7% in 2017. Transferred care from [facility], was last seen there in 2021  On her initial visit to our clinic she had an A1c of 8.2%, reduced Toujeo, and continued prandial dose of insulin, and provided her with a correction scale to use for NovoLog  Due to complaints of burning of the feet disrupting her sleep I attempted to increase her gabapentin but she called back later asking to return to the original dose of 100 mg because the 300 mg dose was too strong   SUBJECTIVE:   During the last visit (08/22/2021): A1c 8.2%  Today (04/03/22): [name] is here for follow-up on diabetes management.  She checks her blood sugars *** times daily. The patient has *** had hypoglycemic episodes since the last clinic visit, which typically occur *** x / - most often occuring ***. The patient is *** symptomatic with these episodes, with symptoms of {symptoms; hypoglycemia:[id number]}.    Patient presented to the ED in April 2023 with nausea and vomiting, she was noted to have BG reading of 297 mg/DL but was also noted to have hypercalcemia with low PTH and a vitamin D of 92, it appears that she was taking once weekly vitamin D but more frequent.  CT abdomen showed innumerable liver lesions and splenic lesions she also tested positive for HIV but HIV-1 and HIV-2confirm negative.  Liver biopsy confirmed no malignancy,  consistent with sarcoidosis, she was referred to rheumatology    HOME DIABETES REGIMEN: Toujeo  55 units daily in AM  Novolog 10 units with each meal  Correction factor: NovoLog(BG -130/30)    Statin: no ACE-I/ARB: yes  METER DOWNLOAD SUMMARY: Did not bring  DIABETIC COMPLICATIONS: Microvascular complications:  Neuropathy Denies: CKD, retinopathy  Last eye exam: Completed 2021  Macrovascular complications:   Denies: CAD, PVD, CVA   PAST HISTORY: Past Medical History:  Past Medical History:  Diagnosis Date   Type 2 diabetes mellitus (HCC)    Past Surgical History:  Past Surgical History:  Procedure Laterality Date   CESAREAN SECTION  03/05/2012   Procedure: CESAREAN SECTION;  Surgeon: Fortino Sic, MD;  Location: [facility];  Service: Obstetrics;  Laterality: N/A;    Social History:  reports that she has never smoked. She does not have any smokeless tobacco history on file. She reports that she does not drink alcohol and does not use drugs. Family History: No family history on file.   HOME MEDICATIONS: Allergies as of 04/03/2022   No Known Allergies      Medication List        Accurate as of April 03, 2022  7:16 AM. If you have any questions, ask your nurse or doctor.          Contour Next Test test strip Generic drug: glucose blood 4 (four) times daily.   FreeStyle Cataract 2 Reader Hamilton 1 Device  by Does not apply route as directed.   FreeStyle Libre 2 Sensor Misc 1 Device by Does not apply route every 14 (fourteen) days.   ibuprofen 600 MG tablet Commonly known as: ADVIL Take 1 tablet (600 mg total) by mouth every 6 (six) hours as needed.   Insulin Pen Needle 32G X 4 MM Misc 1 Device by Does not apply route in the morning, at noon, in the evening, and at bedtime.   Pen Needles 31G X 5 MM Misc Use to check blood sugar 4 times daily   IRON PO Take 1 tablet by mouth daily.   lisinopril 20 MG tablet Commonly known as: ZESTRIL Take 1 tablet  (20 mg total) by mouth daily.   NovoLOG FlexPen 100 UNIT/ML FlexPen Generic drug: insulin aspart Max daily 100 units What changed: additional instructions   omeprazole 20 MG capsule Commonly known as: PRILOSEC Take 20 mg by mouth daily.   ondansetron 4 MG tablet Commonly known as: ZOFRAN Take 1 tablet (4 mg total) by mouth every 6 (six) hours as needed for nausea.   oxyCODONE 5 MG immediate release tablet Commonly known as: Roxicodone Take 1 tablet (5 mg total) by mouth every 6 (six) hours as needed for severe pain.   tamsulosin 0.4 MG Caps capsule Commonly known as: Flomax Take 1 capsule (0.4 mg total) by mouth daily.   Toujeo SoloStar 300 UNIT/ML Solostar Pen Generic drug: insulin glargine (1 Unit Dial) Inject 46 Units into the skin daily.         ALLERGIES: No Known Allergies   REVIEW OF SYSTEMS: A comprehensive ROS was conducted with the patient and is negative except as per HPI and below:  ROS    OBJECTIVE:   VITAL SIGNS: There were no vitals taken for this visit.   PHYSICAL EXAM:  General: Pt appears well and is in NAD  Hydration: Well-hydrated with moist mucous membranes and good skin turgor  HEENT: Head: Unremarkable with good dentition. Oropharynx clear without exudate.  Eyes: External eye exam normal without stare, lid lag or exophthalmos.  EOM intact.  PERRL.  Neck: General: Supple without adenopathy or carotid bruits. Thyroid: Thyroid size normal.  No goiter or nodules appreciated. No thyroid bruit.  Lungs: Clear with good BS bilat with no rales, rhonchi, or wheezes  Heart: RRR with normal S1 and S2 and no gallops; no murmurs; no rub  Abdomen: Normoactive bowel sounds, soft, nontender, without masses or organomegaly palpable  Extremities:  Lower extremities - No pretibial edema. No lesions.  Skin: Normal texture and temperature to palpation. No rash noted. No Acanthosis nigricans/skin tags. No lipohypertrophy.  Neuro: MS is good with appropriate  affect, pt is alert and Ox3    DM foot exam: 08/22/2021  The skin of the feet is intact without sores or ulcerations. The pedal pulses are 2+ on right and 2+ on left. The sensation is intact to a screening 5.07, 10 gram monofilament bilaterally   DATA REVIEWED:   Latest Reference Range & Units 08/22/21 09:25  Sodium 135 - 145 mEq/L 135  Potassium 3.5 - 5.1 mEq/L 4.6  Chloride 96 - 112 mEq/L 100  CO2 19 - 32 mEq/L 30  Glucose 70 - 99 mg/dL 180 (H)  BUN 6 - 23 mg/dL 17  Creatinine 0.40 - 1.20 mg/dL 0.75  Calcium 8.4 - 10.5 mg/dL 9.7  GFR >60.00 mL/min 92.53  Total CHOL/HDL Ratio  4  Cholesterol 0 - 200 mg/dL 167  HDL Cholesterol >39.00 mg/dL 38.50 (  L)  LDL (calc) 0 - 99 mg/dL 453 (H)  MICROALB/CREAT RATIO 0.0 - 30.0 mg/g 1.7  NonHDL  128.47  Triglycerides 0.0 - 149.0 mg/dL 646.8  VLDL 0.0 - 03.2 mg/dL 12.2    Latest Reference Range & Units 08/22/21 09:25  VITD 30.00 - 100.00 ng/mL 24.73 (L)     Latest Reference Range & Units 08/22/21 09:25  TSH 0.35 - 5.50 uIU/mL 1.03  T4,Free(Direct) 0.60 - 1.60 ng/dL 4.82    Latest Reference Range & Units 08/22/21 09:25  Creatinine,U mg/dL 50.0  Microalb, Ur 0.0 - 1.9 mg/dL <3.7  MICROALB/CREAT RATIO 0.0 - 30.0 mg/g 1.7     ASSESSMENT / PLAN / RECOMMENDATIONS:   1) Type 1 Diabetes Mellitus, Poorly controlled, With neuropathic complications - Most recent A1c of 8.2 %. Goal A1c < 7.0 %.    Plan: GENERAL: I have discussed with the patient the pathophysiology of diabetes. We went over the natural progression of the disease. I explained the complications associated with diabetes including retinopathy, nephropathy, neuropathy as well as increased risk of cardiovascular disease. We went over the benefit seen with glycemic control.   I explained to the patient that diabetic patients are at higher than normal risk for amputations.  She was unable to understand the concept of carb counting  Due to reported hypoglycemia around 2 AM , no  data available for me I am going to reduce Toujeo No changes to her prandial dose of insulin again due to lack of glucose data She will be provided with a correction scale Freestyle Libre 2's sent to the pharmacy  MEDICATIONS: Decrease Toujeo 46 units daily Continue NovoLog 20 units with each meal Correction factor: NovoLog(BG -130/30)  EDUCATION / INSTRUCTIONS: BG monitoring instructions: Patient is instructed to check her blood sugars 3 times a day, before meals . Call [facility] if: BG persistently < 70  I reviewed the Rule of 15 for the treatment of hypoglycemia in detail with the patient. Literature supplied.   2) Diabetic complications:  Eye: Does not have known diabetic retinopathy.  Neuro/ Feet: Does  have known diabetic peripheral neuropathy. Renal: Patient does not have known baseline CKD. She is  on an ACEI/ARB at present.  3) Dyslipidemia:   -LDL above goal at 102 mg/DL.  We will emphasize importance of low-fat diet and will discuss statin therapy on the next visit    4) Peripheral Neuropathy:  -Patient states she is on small dose of gabapentin and she continues to wake up at night with leg pains -We will increase gabapentin, cautioned against drowsiness    Medication gabapentin 100 mg daily   Follow-up in 3 months  Signed electronically by: Lyndle Herrlich, MD  [facility]  [facility] [address] [place] [place], [place] [address] Phone: [phone] FAX: [phone]   CC: Clayborne Dana, NP [address] [place] [address] Phone: [phone]  Fax: [phone]    Return to Endocrinology clinic as below: Future Appointments  Date Time Provider Department Center  04/03/2022  8:30 AM Nikko Goldwire, Konrad Dolores, MD [id number] None  04/28/2022 10:40 AM [facility] 1 [id number]

## 2022-04-04 ENCOUNTER — Other Ambulatory Visit (HOSPITAL_COMMUNITY): Payer: Self-pay

## 2022-04-04 ENCOUNTER — Telehealth: Payer: Self-pay

## 2022-04-04 LAB — ANGIOTENSIN CONVERTING ENZYME: Angiotensin-Converting Enzyme: 144 U/L — ABNORMAL HIGH (ref 9–67)

## 2022-04-04 NOTE — Telephone Encounter (Signed)
Patient Advocate Encounter   Received notification that prior authorization is required for Toujeo SoloStar 300UNIT/ML pen-injectors. PA submitted and APPROVED on 04/04/22.  Key [id number] Effective: 04/04/22 - 04/04/23

## 2022-04-04 NOTE — Telephone Encounter (Signed)
Patient states that pharmacy sent over a PA for the [facility]

## 2022-04-04 NOTE — Telephone Encounter (Signed)
LMTCB and also sent a mychart message

## 2022-04-07 ENCOUNTER — Other Ambulatory Visit: Payer: Self-pay

## 2022-04-07 ENCOUNTER — Other Ambulatory Visit: Payer: Self-pay | Admitting: Internal Medicine

## 2022-04-07 DIAGNOSIS — E1065 Type 1 diabetes mellitus with hyperglycemia: Secondary | ICD-10-CM

## 2022-04-07 MED ORDER — TOUJEO SOLOSTAR 300 UNIT/ML ~~LOC~~ SOPN: 46.0000 [IU] | PEN_INJECTOR | Freq: Every day | SUBCUTANEOUS | 0 refills | Status: DC

## 2022-04-07 MED ORDER — BASAGLAR KWIKPEN 100 UNIT/ML ~~LOC~~ SOPN: 46.0000 [IU] | PEN_INJECTOR | Freq: Every day | SUBCUTANEOUS | 6 refills | Status: DC

## 2022-04-07 NOTE — Telephone Encounter (Signed)
Patient son Genene Churn states that [employer] prefers [employer] instead of [employer].  Would be more cost effective for patient.

## 2022-04-08 ENCOUNTER — Other Ambulatory Visit (HOSPITAL_COMMUNITY): Payer: Self-pay

## 2022-04-08 ENCOUNTER — Telehealth: Payer: Self-pay

## 2022-04-08 NOTE — Telephone Encounter (Signed)
Patient Advocate Encounter   Received notification that prior authorization for NovoLOG FlexPen 100UNIT/ML pen-injectors is required/requested.   PA submitted on 04/08/2022 to [employer] via CoverMyMeds Key [id number]  Status is pending

## 2022-04-10 ENCOUNTER — Other Ambulatory Visit (HOSPITAL_COMMUNITY): Payer: Self-pay

## 2022-04-10 ENCOUNTER — Ambulatory Visit: Payer: Commercial Managed Care - HMO | Admitting: Internal Medicine

## 2022-04-10 NOTE — Progress Notes (Deleted)
Name: [name]  MRN/ DOB: [id number], 09-Jul-1970   Age/ Sex: 51 y.o., female    PCP: Terrilyn Saver, NP   Reason for Endocrinology Evaluation: Type 1 Diabetes Mellitus     Date of Initial Endocrinology Visit: 08/22/2021    PATIENT IDENTIFIER: [name] is a 51 y.o. female with a past medical history of T1DM, sarcoidosis (2023). The patient presented for initial endocrinology clinic visit on 08/22/2021 for consultative assistance with her diabetes management.    HPI: [name] was    Diagnosed with DM in 2010  Hemoglobin A1c has ranged from 8.5% in 2021, peaking at 10.7% in 2017. Transferred care from [facility], was last seen there in 2021  On her initial visit to our clinic she had an A1c of 8.2%, reduced Toujeo, and continued prandial dose of insulin, and provided her with a correction scale to use for NovoLog  Due to complaints of burning of the feet disrupting her sleep I attempted to increase her gabapentin but she called back later asking to return to the original dose of 100 mg because the 300 mg dose was too strong   SUBJECTIVE:   During the last visit (08/22/2021): A1c 8.2%  Today (04/10/22): [name] is here for follow-up on diabetes management.  She checks her blood sugars *** times daily. The patient has *** had hypoglycemic episodes since the last clinic visit, which typically occur *** x / - most often occuring ***. The patient is *** symptomatic with these episodes, with symptoms of {symptoms; hypoglycemia:[id number]}.    Patient presented to the ED in April 2023 with nausea and vomiting, she was noted to have BG reading of 297 mg/DL but was also noted to have hypercalcemia with low PTH and a vitamin D of 92, it appears that she was taking once weekly vitamin D but more frequent.  CT abdomen showed innumerable liver lesions and splenic lesions she also tested positive for HIV but HIV-1 and HIV-2confirm negative.  Liver biopsy confirmed no malignancy,  consistent with sarcoidosis, she was referred to rheumatology    HOME DIABETES REGIMEN: Toujeo  55 units daily in AM  Novolog 10 units with each meal  Correction factor: NovoLog(BG -130/30)    Statin: no ACE-I/ARB: yes  METER DOWNLOAD SUMMARY: Did not bring  DIABETIC COMPLICATIONS: Microvascular complications:  Neuropathy Denies: CKD, retinopathy  Last eye exam: Completed 2021  Macrovascular complications:   Denies: CAD, PVD, CVA   PAST HISTORY: Past Medical History:  Past Medical History:  Diagnosis Date   Type 2 diabetes mellitus ([place])    Past Surgical History:  Past Surgical History:  Procedure Laterality Date   CESAREAN SECTION  03/05/2012   Procedure: CESAREAN SECTION;  Surgeon: Avel Sensor, MD;  Location: [facility];  Service: Obstetrics;  Laterality: N/A;    Social History:  reports that she has never smoked. She does not have any smokeless tobacco history on file. She reports that she does not drink alcohol and does not use drugs. Family History: No family history on file.   HOME MEDICATIONS: Allergies as of 04/10/2022   No Known Allergies      Medication List        Accurate as of April 10, 2022  7:28 AM. If you have any questions, ask your nurse or doctor.          Basaglar KwikPen 100 UNIT/ML Inject 46 Units into the skin daily.   Contour Next Test test strip Generic drug: glucose blood  4 (four) times daily.   FreeStyle Libre 2 Reader Devi 1 Device by Does not apply route as directed.   FreeStyle Libre 2 Sensor Misc 1 Device by Does not apply route every 14 (fourteen) days.   ibuprofen 600 MG tablet Commonly known as: ADVIL Take 1 tablet (600 mg total) by mouth every 6 (six) hours as needed.   Insulin Pen Needle 32G X 4 MM Misc 1 Device by Does not apply route in the morning, at noon, in the evening, and at bedtime.   Pen Needles 31G X 5 MM Misc Use to check blood sugar 4 times daily   IRON PO Take 1 tablet by mouth  daily.   lisinopril 20 MG tablet Commonly known as: ZESTRIL Take 1 tablet (20 mg total) by mouth daily.   NovoLOG FlexPen 100 UNIT/ML FlexPen Generic drug: insulin aspart Max daily 100 units What changed: additional instructions   omeprazole 20 MG capsule Commonly known as: PRILOSEC Take 20 mg by mouth daily.   ondansetron 4 MG tablet Commonly known as: ZOFRAN Take 1 tablet (4 mg total) by mouth every 6 (six) hours as needed for nausea.   oxyCODONE 5 MG immediate release tablet Commonly known as: Roxicodone Take 1 tablet (5 mg total) by mouth every 6 (six) hours as needed for severe pain.   tamsulosin 0.4 MG Caps capsule Commonly known as: Flomax Take 1 capsule (0.4 mg total) by mouth daily.         ALLERGIES: No Known Allergies   REVIEW OF SYSTEMS: A comprehensive ROS was conducted with the patient and is negative except as per HPI and below:  ROS    OBJECTIVE:   VITAL SIGNS: There were no vitals taken for this visit.   PHYSICAL EXAM:  General: Pt appears well and is in NAD  Hydration: Well-hydrated with moist mucous membranes and good skin turgor  HEENT: Head: Unremarkable with good dentition. Oropharynx clear without exudate.  Eyes: External eye exam normal without stare, lid lag or exophthalmos.  EOM intact.  PERRL.  Neck: General: Supple without adenopathy or carotid bruits. Thyroid: Thyroid size normal.  No goiter or nodules appreciated. No thyroid bruit.  Lungs: Clear with good BS bilat with no rales, rhonchi, or wheezes  Heart: RRR with normal S1 and S2 and no gallops; no murmurs; no rub  Abdomen: Normoactive bowel sounds, soft, nontender, without masses or organomegaly palpable  Extremities:  Lower extremities - No pretibial edema. No lesions.  Skin: Normal texture and temperature to palpation. No rash noted. No Acanthosis nigricans/skin tags. No lipohypertrophy.  Neuro: MS is good with appropriate affect, pt is alert and Ox3    DM foot exam:  08/22/2021  The skin of the feet is intact without sores or ulcerations. The pedal pulses are 2+ on right and 2+ on left. The sensation is intact to a screening 5.07, 10 gram monofilament bilaterally   DATA REVIEWED:   Latest Reference Range & Units 08/22/21 09:25  Sodium 135 - 145 mEq/L 135  Potassium 3.5 - 5.1 mEq/L 4.6  Chloride 96 - 112 mEq/L 100  CO2 19 - 32 mEq/L 30  Glucose 70 - 99 mg/dL 180 (H)  BUN 6 - 23 mg/dL 17  Creatinine 0.40 - 1.20 mg/dL 0.75  Calcium 8.4 - 10.5 mg/dL 9.7  GFR >60.00 mL/min 92.53  Total CHOL/HDL Ratio  4  Cholesterol 0 - 200 mg/dL 167  HDL Cholesterol >39.00 mg/dL 38.50 (L)  LDL (calc) 0 - 99 mg/dL 102 (  H)  MICROALB/CREAT RATIO 0.0 - 30.0 mg/g 1.7  NonHDL  128.47  Triglycerides 0.0 - 149.0 mg/dL 131.0  VLDL 0.0 - 40.0 mg/dL 26.2    Latest Reference Range & Units 08/22/21 09:25  VITD 30.00 - 100.00 ng/mL 24.73 (L)     Latest Reference Range & Units 08/22/21 09:25  TSH 0.35 - 5.50 uIU/mL 1.03  T4,Free(Direct) 0.60 - 1.60 ng/dL 0.90    Latest Reference Range & Units 08/22/21 09:25  Creatinine,U mg/dL 41.3  Microalb, Ur 0.0 - 1.9 mg/dL <0.7  MICROALB/CREAT RATIO 0.0 - 30.0 mg/g 1.7     ASSESSMENT / PLAN / RECOMMENDATIONS:   1) Type 1 Diabetes Mellitus, Poorly controlled, With neuropathic complications - Most recent A1c of 8.2 %. Goal A1c < 7.0 %.    Plan: GENERAL: I have discussed with the patient the pathophysiology of diabetes. We went over the natural progression of the disease. I explained the complications associated with diabetes including retinopathy, nephropathy, neuropathy as well as increased risk of cardiovascular disease. We went over the benefit seen with glycemic control.   I explained to the patient that diabetic patients are at higher than normal risk for amputations.  She was unable to understand the concept of carb counting  Due to reported hypoglycemia around 2 AM , no data available for me I am going to reduce  Toujeo No changes to her prandial dose of insulin again due to lack of glucose data She will be provided with a correction scale Freestyle Libre 2's sent to the pharmacy  MEDICATIONS: Decrease Toujeo 46 units daily Continue NovoLog 20 units with each meal Correction factor: NovoLog(BG -130/30)  EDUCATION / INSTRUCTIONS: BG monitoring instructions: Patient is instructed to check her blood sugars 3 times a day, before meals . Call [facility] if: BG persistently < 70  I reviewed the Rule of 15 for the treatment of hypoglycemia in detail with the patient. Literature supplied.   2) Diabetic complications:  Eye: Does not have known diabetic retinopathy.  Neuro/ Feet: Does  have known diabetic peripheral neuropathy. Renal: Patient does not have known baseline CKD. She is  on an ACEI/ARB at present.  3) Dyslipidemia:   -LDL above goal at 102 mg/DL.  We will emphasize importance of low-fat diet and will discuss statin therapy on the next visit    4) Peripheral Neuropathy:  -Patient states she is on small dose of gabapentin and she continues to wake up at night with leg pains -We will increase gabapentin, cautioned against drowsiness    Medication gabapentin 100 mg daily   Follow-up in 3 months  Signed electronically by: Mack Guise, MD  [facility]  [facility] [address] [place], [place] [address] Phone: [phone] FAX: [phone]   CC: Terrilyn Saver, NP [address] [place] [address] Phone: [phone]  Fax: [phone]    Return to Endocrinology clinic as below: Future Appointments  Date Time Provider [place]  04/10/2022  9:50 AM Foster Sonnier, Melanie Crazier, MD [id number] None  04/28/2022 10:40 AM [facility] 1 [id number]

## 2022-04-14 ENCOUNTER — Other Ambulatory Visit (HOSPITAL_COMMUNITY): Payer: Self-pay

## 2022-04-14 MED ORDER — HUMALOG KWIKPEN 200 UNIT/ML ~~LOC~~ SOPN: PEN_INJECTOR | SUBCUTANEOUS | 3 refills | Status: AC

## 2022-04-14 NOTE — Telephone Encounter (Signed)
Patient Advocate Encounter  Received notification from [employer] that the request for prior authorization for NovoLOG FlexPen 100UNIT/ML pen-injectors has been denied due to no indication that your patient has failure/inadequate response, contraindication per FDA label or intolerance to a Step 1 short-acting insulin such as Humalog cartridges, vials or Kwikpens,  Humalog Mix 75-25 vials or Kwikpens, or Humalog Mix 50-50 vials or Kwikpens.Marland Kitchen     Apidra, Apidra Solostar, Novolog (insulin aspart) cartridges/flexpens/vials, and Novolog Mix 70/30  (insulin aspart prot/insulin aspart) flexpen and vials are considered as medically necessary when  there is a documented failure, inadequate response, contraindication per FDA label, or intolerance  to a Step 1 short-acting insulin such as Humalog cartridges, vials or Kwikpens, Humalog Mix 75-25  vials or Kwikpens, or Humalog Mix 50-50 vials or Kwikpens.

## 2022-04-14 NOTE — Telephone Encounter (Signed)
Medication has been switched. For future reference if you are requesting a change or if it's a denial always route to provider as they will make changes.

## 2022-04-14 NOTE — Telephone Encounter (Signed)
Switched to Humalog 

## 2022-04-14 NOTE — Progress Notes (Signed)
[name],   Please schedule follow-up appointment with Dr. Loanne Drilling after HRCT complete.  Her HRCT is scheduled for the end of October.  New consult slot for sarcoidosis.  Thanks,  BLI  Garner Nash, DO [facility] 04/14/2022 4:47 PM

## 2022-04-15 ENCOUNTER — Telehealth: Payer: Self-pay | Admitting: Pulmonary Disease

## 2022-04-15 NOTE — Telephone Encounter (Signed)
Recall is placed- will pull when [facility] schedule is open.

## 2022-04-15 NOTE — Telephone Encounter (Signed)
Accidentally signed encounter.

## 2022-04-15 NOTE — Telephone Encounter (Signed)
Message received from Dr. Valeta Harms: [name],    Please schedule follow-up appointment with Dr. Loanne Drilling after HRCT complete.  Her HRCT is scheduled for the end of October.  New consult slot for sarcoidosis.   Thanks,   BLI   Garner Nash, DO [facility] 04/14/2022 4:47 PM   With Dr. Cordelia Pen schedule not being available past October, routing to front desk pool to have when we do have a schedule available for her.

## 2022-04-25 ENCOUNTER — Other Ambulatory Visit: Payer: 59

## 2022-04-27 ENCOUNTER — Encounter: Payer: Self-pay | Admitting: Pulmonary Disease

## 2022-04-28 ENCOUNTER — Ambulatory Visit
Admission: RE | Admit: 2022-04-28 | Discharge: 2022-04-28 | Disposition: A | Discharge status: Home / Self Care | Payer: Commercial Managed Care - HMO | Source: Ambulatory Visit | Attending: Pulmonary Disease | Admitting: Pulmonary Disease

## 2022-04-28 DIAGNOSIS — D8689 Sarcoidosis of other sites: Secondary | ICD-10-CM

## 2022-04-28 DIAGNOSIS — R0609 Other forms of dyspnea: Secondary | ICD-10-CM

## 2022-04-28 DIAGNOSIS — M2550 Pain in unspecified joint: Secondary | ICD-10-CM

## 2022-04-28 NOTE — Telephone Encounter (Signed)
Pt's questions addressed. Nothing further needed at this time.

## 2022-04-29 ENCOUNTER — Telehealth: Payer: Self-pay | Admitting: *Deleted

## 2022-04-29 NOTE — Telephone Encounter (Signed)
Patient returning call to schedule a follow up with Dr Loanne Drilling.   Dr Cordelia Pen schedule is not out for November, sent a recall reminder to get scheduled following her CT scan 11/27.  Please call patient if anything else needed. [phone]

## 2022-04-30 NOTE — Telephone Encounter (Signed)
Noted  

## 2022-05-21 ENCOUNTER — Ambulatory Visit (INDEPENDENT_AMBULATORY_CARE_PROVIDER_SITE_OTHER): Payer: Commercial Managed Care - HMO | Admitting: Family Medicine

## 2022-05-21 ENCOUNTER — Encounter: Payer: Self-pay | Admitting: Family Medicine

## 2022-05-21 VITALS — BP 109/63 | HR 85 | Ht 60.0 in | Wt 127.8 lb

## 2022-05-21 DIAGNOSIS — E1065 Type 1 diabetes mellitus with hyperglycemia: Secondary | ICD-10-CM

## 2022-05-21 DIAGNOSIS — E559 Vitamin D deficiency, unspecified: Secondary | ICD-10-CM

## 2022-05-21 DIAGNOSIS — N939 Abnormal uterine and vaginal bleeding, unspecified: Secondary | ICD-10-CM | POA: Diagnosis not present

## 2022-05-21 DIAGNOSIS — E785 Hyperlipidemia, unspecified: Secondary | ICD-10-CM

## 2022-05-21 MED ORDER — MEDROXYPROGESTERONE ACETATE 10 MG PO TABS: 10.0000 mg | ORAL_TABLET | Freq: Every day | ORAL | 0 refills | Status: AC

## 2022-05-21 NOTE — Assessment & Plan Note (Signed)
Poorly controlled with last A1c 8.2% Continue current medications Discussed diet and exercise F/u with endocrinology

## 2022-05-21 NOTE — Assessment & Plan Note (Signed)
-  Repeat CMP and lipid panel today -Diet low in saturated fat -Regular exercise - at least 30 minutes, 5 times per week

## 2022-05-21 NOTE — Progress Notes (Signed)
Established Patient Office Visit  Subjective   Patient ID: [name], female    DOB: 1970-09-29  Age: 51 y.o. MRN: [id number]  CC: routine f/u, abnormal uterine bleeding   HPI   DIABETES (Type 1): - Medications: Basaglar 46 units daily, Lispro sliding scale/meal coverage lisinopril 20 mg daily - Compliance: good - Diet: carb modified  - Exercise: not regular - Eye exam: reports earlier this year, no records - Foot exam: today  - Denies symptoms of hypoglycemia, polyuria, polydipsia, numbness extremities, foot ulcers/trauma, wounds that are not healing, medication side effects  - She follows with endocrinology    Abnormal Uterine bleeding: - She has been bleeding since 11/7 (light for 3 days, now staying heavy). This happened once last year and remembers taking a medicine for 10 day and it helped. She had been regular after that. - Period has been coming every 2 months, usually only lasts for 7-10 days.  - GYN cannot see her until January - they told her to call us to get meds to stop the bleeding   - since the bleeding has worsened she has had more fatigue and body aches as well - history of anemia       ROS All review of systems negative except what is listed in the HPI    Objective:     BP 109/63   Pulse 85   Ht 5' (1.524 m)   Wt 127 lb 12.8 oz (58 kg)   BMI 24.96 kg/m    Physical Exam Vitals reviewed.  Constitutional:      Appearance: Normal appearance.  Cardiovascular:     Rate and Rhythm: Normal rate and regular rhythm.     Pulses: Normal pulses.     Heart sounds: Normal heart sounds.  Pulmonary:     Effort: Pulmonary effort is normal.     Breath sounds: Normal breath sounds.  Musculoskeletal:     Cervical back: Normal range of motion and neck supple.  Skin:    General: Skin is warm and dry.     Comments: Diabetic foot exam was performed.  No deformities or other abnormal visual findings.  Posterior tibialis and dorsalis pulse intact  bilaterally.  Intact to touch and monofilament testing bilaterally.    Neurological:     Mental Status: She is alert and oriented to person, place, and time.  Psychiatric:        Mood and Affect: Mood normal.        Behavior: Behavior normal.        Thought Content: Thought content normal.        Judgment: Judgment normal.         No results found for any visits on 05/21/22.    The 10-year ASCVD risk score (Arnett DK, et al., 2019) is: 2.3%    Assessment & Plan:   Problem List Items Addressed This Visit       Endocrine   Type 1 diabetes mellitus with hyperglycemia (HCC) - Primary (Chronic)    Poorly controlled with last A1c 8.2% Continue current medications Discussed diet and exercise F/u with endocrinology        Relevant Orders   Hemoglobin A1c   CBC   Comprehensive metabolic panel   Lipid panel   TSH     Other   Dyslipidemia    -Repeat CMP and lipid panel today -Diet low in saturated fat -Regular exercise - at least 30 minutes, 5 times per week  Relevant Orders   CBC   Comprehensive metabolic panel   Lipid panel   TSH   Vitamin D insufficiency    Updating labs today       Relevant Orders   Vitamin D (25 hydroxy)   Other Visit Diagnoses     Abnormal uterine bleeding     Adding Provera Keep upcoming appointment with GYN    Relevant Medications   medroxyPROGESTERone (PROVERA) 10 MG tablet   Other Relevant Orders   CBC   Iron, TIBC and Ferritin Panel       Return in about 6 months (around 11/19/2022) for routine f/u .    Clayborne Dana, NP

## 2022-05-21 NOTE — Patient Instructions (Signed)
Sending in the medication to help stop your bleeding. I want you to keep GYN follow-up appointment.  Updating blood work today - we will let you know results.  Keep upcoming endocrinology appointment.

## 2022-05-21 NOTE — Assessment & Plan Note (Signed)
Updating labs today. 

## 2022-05-22 LAB — IRON,TIBC AND FERRITIN PANEL
%SAT: 10 % (calc) — ABNORMAL LOW (ref 16–45)
Ferritin: 24 ng/mL (ref 16–232)
Iron: 32 ug/dL — ABNORMAL LOW (ref 45–160)
TIBC: 330 mcg/dL (calc) (ref 250–450)

## 2022-05-22 LAB — COMPREHENSIVE METABOLIC PANEL
AG Ratio: 1 (calc) (ref 1.0–2.5)
ALT: 11 U/L (ref 6–29)
AST: 11 U/L (ref 10–35)
Albumin: 4 g/dL (ref 3.6–5.1)
Alkaline phosphatase (APISO): 112 U/L (ref 37–153)
BUN/Creatinine Ratio: 36 (calc) — ABNORMAL HIGH (ref 6–22)
BUN: 29 mg/dL — ABNORMAL HIGH (ref 7–25)
CO2: 26 mmol/L (ref 20–32)
Calcium: 9.6 mg/dL (ref 8.6–10.4)
Chloride: 96 mmol/L — ABNORMAL LOW (ref 98–110)
Creat: 0.81 mg/dL (ref 0.50–1.03)
Globulin: 4.1 g/dL (calc) — ABNORMAL HIGH (ref 1.9–3.7)
Glucose, Bld: 491 mg/dL — ABNORMAL HIGH (ref 65–99)
Potassium: 4.4 mmol/L (ref 3.5–5.3)
Sodium: 128 mmol/L — ABNORMAL LOW (ref 135–146)
Total Bilirubin: 0.4 mg/dL (ref 0.2–1.2)
Total Protein: 8.1 g/dL (ref 6.1–8.1)

## 2022-05-22 LAB — LIPID PANEL
Cholesterol: 185 mg/dL (ref <200)
HDL: 42 mg/dL — ABNORMAL LOW (ref >=50)
Non-HDL Cholesterol (Calc): 143 mg/dL (calc) — ABNORMAL HIGH (ref <130)
Total CHOL/HDL Ratio: 4.4 (calc) (ref <5.0)
Triglycerides: 445 mg/dL — ABNORMAL HIGH (ref <150)

## 2022-05-22 LAB — CBC
HCT: 34.1 % — ABNORMAL LOW (ref 35.0–45.0)
Hemoglobin: 11.1 g/dL — ABNORMAL LOW (ref 11.7–15.5)
MCH: 27.8 pg (ref 27.0–33.0)
MCHC: 32.6 g/dL (ref 32.0–36.0)
MCV: 85.5 fL (ref 80.0–100.0)
MPV: 12 fL (ref 7.5–12.5)
Platelets: 324 10*3/uL (ref 140–400)
RBC: 3.99 10*6/uL (ref 3.80–5.10)
RDW: 13.5 % (ref 11.0–15.0)
WBC: 7.2 10*3/uL (ref 3.8–10.8)

## 2022-05-22 LAB — HEMOGLOBIN A1C
Hgb A1c MFr Bld: 10.6 % of total Hgb — ABNORMAL HIGH (ref <5.7)
Mean Plasma Glucose: 258 mg/dL
eAG (mmol/L): 14.3 mmol/L

## 2022-05-22 LAB — TSH: TSH: 0.78 mIU/L

## 2022-05-22 LAB — VITAMIN D 25 HYDROXY (VIT D DEFICIENCY, FRACTURES): Vit D, 25-Hydroxy: 28 ng/mL — ABNORMAL LOW (ref 30–100)

## 2022-05-25 NOTE — Progress Notes (Signed)
Your A1c has jumped up. Please contact endocrinology to schedule a sooner follow-up if possible. In the meantime, start increasing your long-acting insulin by 2 units every 2-3 days until fasting sugars start normalizing (goal <130). Blood counts are stable Sodium is slightly lower than it has been, but stable when corrected for your high blood sugar - if you are feeling poorly or having any new symptoms, you need to go to the emergency department.  Triglycerides are quite high, let's recheck a fasting level - please schedule a 2-3 week follow-up with me so we can recheck and monitor your diabetes a little more closely until you can see endocrinology.  Iron is low. Start taking an over-the-counter iron supplement (ferrous sulfate 325 mg) three days per week. Vitamin D is slightly low. Start taking over-the counter vitamin D 600-800 units daily. Thyroid is normal.

## 2022-05-26 ENCOUNTER — Ambulatory Visit
Admission: RE | Admit: 2022-05-26 | Discharge: 2022-05-26 | Disposition: A | Discharge status: Home / Self Care | Payer: Commercial Managed Care - HMO | Source: Ambulatory Visit | Attending: Pulmonary Disease | Admitting: Pulmonary Disease

## 2022-05-26 DIAGNOSIS — R0609 Other forms of dyspnea: Secondary | ICD-10-CM

## 2022-05-26 DIAGNOSIS — M2550 Pain in unspecified joint: Secondary | ICD-10-CM

## 2022-05-26 DIAGNOSIS — D8689 Sarcoidosis of other sites: Secondary | ICD-10-CM

## 2022-05-28 ENCOUNTER — Other Ambulatory Visit: Payer: Self-pay | Admitting: Family Medicine

## 2022-05-28 DIAGNOSIS — N939 Abnormal uterine and vaginal bleeding, unspecified: Secondary | ICD-10-CM

## 2022-05-29 NOTE — Progress Notes (Signed)
Please schedule appt with Dr. Everardo All for sarcoidosis.   Thanks,  BLI  Josephine Igo, DO [facility] 05/29/2022 9:05 AM

## 2022-06-02 NOTE — Progress Notes (Signed)
Called the pt and there was no answer- LMTCB    

## 2022-06-04 ENCOUNTER — Encounter: Payer: Self-pay | Admitting: *Deleted

## 2022-06-04 NOTE — Progress Notes (Signed)
LMTCB and also went ahead and mailed letter

## 2022-06-11 ENCOUNTER — Encounter: Payer: Self-pay | Admitting: Family Medicine

## 2022-06-11 ENCOUNTER — Ambulatory Visit (INDEPENDENT_AMBULATORY_CARE_PROVIDER_SITE_OTHER): Payer: Commercial Managed Care - HMO | Admitting: Family Medicine

## 2022-06-11 ENCOUNTER — Ambulatory Visit (HOSPITAL_BASED_OUTPATIENT_CLINIC_OR_DEPARTMENT_OTHER)
Admission: RE | Admit: 2022-06-11 | Discharge: 2022-06-11 | Disposition: A | Discharge status: Home / Self Care | Payer: Commercial Managed Care - HMO | Source: Ambulatory Visit | Attending: Family Medicine | Admitting: Family Medicine

## 2022-06-11 VITALS — BP 101/60 | HR 80 | Temp 97.4°F | Resp 16 | Ht 60.0 in | Wt 127.0 lb

## 2022-06-11 DIAGNOSIS — E1065 Type 1 diabetes mellitus with hyperglycemia: Secondary | ICD-10-CM

## 2022-06-11 DIAGNOSIS — N939 Abnormal uterine and vaginal bleeding, unspecified: Secondary | ICD-10-CM | POA: Insufficient documentation

## 2022-06-11 DIAGNOSIS — E1042 Type 1 diabetes mellitus with diabetic polyneuropathy: Secondary | ICD-10-CM

## 2022-06-11 DIAGNOSIS — E781 Pure hyperglyceridemia: Secondary | ICD-10-CM | POA: Diagnosis not present

## 2022-06-11 MED ORDER — BASAGLAR KWIKPEN 100 UNIT/ML ~~LOC~~ SOPN: 50.0000 [IU] | PEN_INJECTOR | Freq: Every day | SUBCUTANEOUS | 6 refills | Status: DC

## 2022-06-11 NOTE — Patient Instructions (Signed)
Start monitoring your fasting blood sugar every day and keep a log. You can follow-up here in 2 weeks to reassess before you get in with endocrinology. Update fasting labs this week. Pelvic ultrasound to check on your bleeding

## 2022-06-11 NOTE — Assessment & Plan Note (Signed)
She increased her basal insulin to 50 units daily, but has not been regularly monitoring fasting CBG. Recommend she start keeping log of daily fasting glucose and increase long actingin insulin by 2 units every 3 days until fasting glucose is closer to 130. Continue lifestyle measures.  Keep upcoming endocrinology appointment - she can follow-up here in 2 weeks to review glucose log and make any additional changes in the meantime.  Patient aware of signs/symptoms requiring further/urgent evaluation.

## 2022-06-11 NOTE — Progress Notes (Signed)
Established Patient Office Visit  Subjective   Patient ID: [name], female    DOB: Jan 28, 1971  Age: 51 y.o. MRN: [id number]  Chief Complaint  Patient presents with   Follow-up    Follow up    HPI  Patient is here for diabetes follow-up because she cannot get in with her endocrinologist until January 11th. She has increased to 50 units insulin glargine, but has not been monitoring her glucose regularly. Still doing 20 units of meal time coverage. Denies any new symptoms. Triglycerides were elevated last time, she would like to recheck fasting level another day this week.   She took the Provera for abnormal uterine bleeding, but she is still bleeding, though not quite as heavy as before. She was able to get an appointment with [facility] but not until January 3rd because of insurance issues. She would like a referral to [facility] to see if they could see her sooner. She would like to go ahead with [place], reports she had a normal one a few years ago. No other new symptoms.        ROS All review of systems negative except what is listed in the HPI    Objective:     BP 101/60   Pulse 80   Temp (!) 97.4 F (36.3 C) (Oral)   Resp 16   Ht 5' (1.524 m)   Wt 127 lb (57.6 kg)   SpO2 100%   BMI 24.80 kg/m    Physical Exam Vitals reviewed.  Constitutional:      Appearance: Normal appearance.  Cardiovascular:     Rate and Rhythm: Normal rate and regular rhythm.     Pulses: Normal pulses.     Heart sounds: Normal heart sounds.  Pulmonary:     Effort: Pulmonary effort is normal.     Breath sounds: Normal breath sounds.  Musculoskeletal:     Right lower leg: No edema.     Left lower leg: No edema.  Skin:    General: Skin is warm and dry.  Neurological:     Mental Status: She is alert and oriented to person, place, and time.  Psychiatric:        Mood and Affect: Mood normal.        Behavior: Behavior normal.        Thought Content: Thought content normal.         Judgment: Judgment normal.      No results found for any visits on 06/11/22.    The 10-year ASCVD risk score (Arnett DK, et al., 2019) is: 2.1%    Assessment & Plan:   Problem List Items Addressed This Visit       Endocrine   Type 1 diabetes mellitus with diabetic polyneuropathy (HCC) - Primary    She increased her basal insulin to 50 units daily, but has not been regularly monitoring fasting CBG. Recommend she start keeping log of daily fasting glucose and increase long acting insulin by 2 units every 3 days until fasting glucose is closer to 130. Continue lifestyle measures.  Keep upcoming endocrinology appointment - she can follow-up here in 2 weeks to review glucose log and make any additional changes in the meantime.  Patient aware of signs/symptoms requiring further/urgent evaluation.       Relevant Medications   Insulin Glargine (BASAGLAR KWIKPEN) 100 UNIT/ML   Other Visit Diagnoses     Abnormal uterine bleeding     Recheck CBC to ensure no worsening  anemia. Continue iron supplement. Referral to [facility] to see if she can be seen sooner than next month.  Ordering pelvic US Patient aware of signs/symptoms requiring further/urgent evaluation.     Relevant Orders   US Pelvic Complete With Transvaginal   CBC   Ambulatory referral to Obstetrics / Gynecology   Hypertriglyceridemia     Repeat fasting labs   Relevant Orders   Lipid panel   Basic Metabolic Panel (BMET)       Return in about 2 weeks (around 06/25/2022) for glucose f/u .    Clayborne Dana, NP

## 2022-06-12 MED ORDER — FREESTYLE LIBRE 3 SENSOR MISC: 1.0000 | 11 refills | Status: AC

## 2022-06-13 ENCOUNTER — Other Ambulatory Visit: Payer: Commercial Managed Care - HMO

## 2022-06-16 ENCOUNTER — Other Ambulatory Visit (INDEPENDENT_AMBULATORY_CARE_PROVIDER_SITE_OTHER): Payer: Commercial Managed Care - HMO

## 2022-06-16 ENCOUNTER — Other Ambulatory Visit: Payer: Commercial Managed Care - HMO

## 2022-06-16 DIAGNOSIS — N939 Abnormal uterine and vaginal bleeding, unspecified: Secondary | ICD-10-CM

## 2022-06-16 DIAGNOSIS — E781 Pure hyperglyceridemia: Secondary | ICD-10-CM

## 2022-06-16 LAB — CBC
HCT: 28.4 % — ABNORMAL LOW (ref 36.0–46.0)
Hemoglobin: 9.4 g/dL — ABNORMAL LOW (ref 12.0–15.0)
MCHC: 33.1 g/dL (ref 30.0–36.0)
MCV: 84.7 fl (ref 78.0–100.0)
Platelets: 326 10*3/uL (ref 150.0–400.0)
RBC: 3.36 Mil/uL — ABNORMAL LOW (ref 3.87–5.11)
RDW: 14.9 % (ref 11.5–15.5)
WBC: 5.4 10*3/uL (ref 4.0–10.5)

## 2022-06-16 LAB — BASIC METABOLIC PANEL
BUN: 21 mg/dL (ref 6–23)
CO2: 25 mEq/L (ref 19–32)
Calcium: 9.2 mg/dL (ref 8.4–10.5)
Chloride: 103 mEq/L (ref 96–112)
Creatinine, Ser: 0.82 mg/dL (ref 0.40–1.20)
GFR: 82.66 mL/min (ref >60.00)
Glucose, Bld: 265 mg/dL — ABNORMAL HIGH (ref 70–99)
Potassium: 4.6 mEq/L (ref 3.5–5.1)
Sodium: 135 mEq/L (ref 135–145)

## 2022-06-16 LAB — LIPID PANEL
Cholesterol: 174 mg/dL (ref 0–200)
HDL: 49.1 mg/dL (ref >39.00)
LDL Cholesterol: 102 mg/dL — ABNORMAL HIGH (ref 0–99)
NonHDL: 125.18
Total CHOL/HDL Ratio: 4
Triglycerides: 118 mg/dL (ref 0.0–149.0)
VLDL: 23.6 mg/dL (ref 0.0–40.0)

## 2022-06-19 ENCOUNTER — Encounter: Payer: Self-pay | Admitting: Family Medicine

## 2022-06-20 ENCOUNTER — Encounter: Payer: Self-pay | Admitting: Family Medicine

## 2022-06-27 ENCOUNTER — Ambulatory Visit: Payer: Commercial Managed Care - HMO | Admitting: Family Medicine

## 2022-07-04 ENCOUNTER — Encounter: Payer: Self-pay | Admitting: Family Medicine

## 2022-07-04 ENCOUNTER — Ambulatory Visit (INDEPENDENT_AMBULATORY_CARE_PROVIDER_SITE_OTHER): Payer: BLUE CROSS/BLUE SHIELD | Admitting: Family Medicine

## 2022-07-04 VITALS — BP 116/69 | HR 87 | Temp 98.4°F | Resp 16 | Ht 60.0 in | Wt 131.0 lb

## 2022-07-04 DIAGNOSIS — E1065 Type 1 diabetes mellitus with hyperglycemia: Secondary | ICD-10-CM

## 2022-07-04 DIAGNOSIS — E1042 Type 1 diabetes mellitus with diabetic polyneuropathy: Secondary | ICD-10-CM

## 2022-07-04 DIAGNOSIS — E611 Iron deficiency: Secondary | ICD-10-CM | POA: Insufficient documentation

## 2022-07-04 MED ORDER — BASAGLAR KWIKPEN 100 UNIT/ML ~~LOC~~ SOPN: 54.0000 [IU] | PEN_INJECTOR | Freq: Every day | SUBCUTANEOUS | 6 refills | Status: AC

## 2022-07-04 MED ORDER — IRON (FERROUS SULFATE) 325 (65 FE) MG PO TABS: 325.0000 mg | ORAL_TABLET | Freq: Every day | ORAL | 3 refills | Status: AC

## 2022-07-04 NOTE — Progress Notes (Signed)
   Established Patient Office Visit  Subjective   Patient ID: [name], female    DOB: 1971/02/28  Age: 52 y.o. MRN: [id number]  Chief Complaint  Patient presents with   Follow-up    dm    HPI  Patient is here to follow-up on her diabetes (T1DM). At last appointment on 06/11/22 she had not been monitoring her glucose regularly and denied any new symptoms, but there was a delay in getting in with endocrinology so she wanted closer follow-up with [place]. She has since been using [employer] and average glucose for the past 2 weeks is 175. She is still taking 50 units of basal insulin and 20 units of mealtime coverage with sliding scale additionally.  Patient denies any hypo/hyperglycemic events, skin changes, chest pain, palpitations, dyspnea, wheezing, edema, recurrent headaches, vision changes.         ROS All review of systems negative except what is listed in the HPI    Objective:     BP 116/69   Pulse 87   Temp 98.4 F (36.9 C)   Resp 16   Ht 5' (1.524 m)   Wt 131 lb (59.4 kg)   SpO2 100%   BMI 25.58 kg/m    Physical Exam Vitals reviewed.  Constitutional:      Appearance: Normal appearance.  Cardiovascular:     Rate and Rhythm: Normal rate and regular rhythm.     Pulses: Normal pulses.     Heart sounds: Normal heart sounds.  Pulmonary:     Effort: Pulmonary effort is normal.     Breath sounds: Normal breath sounds.  Musculoskeletal:     Right lower leg: No edema.     Left lower leg: No edema.  Skin:    General: Skin is warm and dry.  Neurological:     Mental Status: She is alert and oriented to person, place, and time.  Psychiatric:        Mood and Affect: Mood normal.        Behavior: Behavior normal.        Thought Content: Thought content normal.        Judgment: Judgment normal.      No results found for any visits on 07/04/22.    The 10-year ASCVD risk score (Arnett DK, et al., 2019) is: 2.1%    Assessment & Plan:   Problem List  Items Addressed This Visit       Endocrine   Type 1 diabetes mellitus with hyperglycemia (HCC) (Chronic)    Increase basal insulin to 54 units Continue with CGM Keep endocrinology appointment for next week      Relevant Medications   Insulin Glargine (BASAGLAR KWIKPEN) 100 UNIT/ML   Type 1 diabetes mellitus with diabetic polyneuropathy (HCC)   Relevant Medications   Insulin Glargine (BASAGLAR KWIKPEN) 100 UNIT/ML     Other   Iron deficiency - Primary    She has not been taking iron supplement Will send in for her and recheck labs in 2 months or so       Relevant Medications   Iron, Ferrous Sulfate, 325 (65 Fe) MG TABS    Return in about 3 months (around 10/03/2022) for routine follow-up.    Terrilyn Saver, NP

## 2022-07-04 NOTE — Patient Instructions (Signed)
Recommend increasing your long-acting insulin to 54 units daily and continue with regular meal time coverage and sliding scale.  Continue using Freestyle Libre to monitor your glucose levels

## 2022-07-04 NOTE — Assessment & Plan Note (Signed)
She has not been taking iron supplement Will send in for her and recheck labs in 2 months or so

## 2022-07-04 NOTE — Assessment & Plan Note (Signed)
Increase basal insulin to 54 units Continue with CGM Keep endocrinology appointment for next week

## 2022-07-08 ENCOUNTER — Other Ambulatory Visit: Payer: Self-pay | Admitting: Family Medicine

## 2022-07-08 ENCOUNTER — Ambulatory Visit (INDEPENDENT_AMBULATORY_CARE_PROVIDER_SITE_OTHER): Payer: BLUE CROSS/BLUE SHIELD | Admitting: Family Medicine

## 2022-07-08 ENCOUNTER — Encounter: Payer: Self-pay | Admitting: Family Medicine

## 2022-07-08 ENCOUNTER — Ambulatory Visit (HOSPITAL_BASED_OUTPATIENT_CLINIC_OR_DEPARTMENT_OTHER)
Admission: RE | Admit: 2022-07-08 | Discharge: 2022-07-08 | Disposition: A | Discharge status: Home / Self Care | Payer: BLUE CROSS/BLUE SHIELD | Source: Ambulatory Visit | Attending: Family Medicine | Admitting: Family Medicine

## 2022-07-08 ENCOUNTER — Telehealth: Payer: Self-pay | Admitting: Pulmonary Disease

## 2022-07-08 VITALS — BP 107/68 | HR 79 | Temp 97.7°F | Resp 18 | Ht 60.0 in | Wt 131.6 lb

## 2022-07-08 DIAGNOSIS — R051 Acute cough: Secondary | ICD-10-CM | POA: Diagnosis not present

## 2022-07-08 DIAGNOSIS — R7989 Other specified abnormal findings of blood chemistry: Secondary | ICD-10-CM | POA: Diagnosis not present

## 2022-07-08 DIAGNOSIS — R0781 Pleurodynia: Secondary | ICD-10-CM | POA: Diagnosis not present

## 2022-07-08 LAB — CBC
HCT: 30.6 % — ABNORMAL LOW (ref 36.0–46.0)
Hemoglobin: 10 g/dL — ABNORMAL LOW (ref 12.0–15.0)
MCHC: 32.5 g/dL (ref 30.0–36.0)
MCV: 84.3 fl (ref 78.0–100.0)
Platelets: 396 10*3/uL (ref 150.0–400.0)
RBC: 3.63 Mil/uL — ABNORMAL LOW (ref 3.87–5.11)
RDW: 14.4 % (ref 11.5–15.5)
WBC: 6.8 10*3/uL (ref 4.0–10.5)

## 2022-07-08 LAB — D-DIMER, QUANTITATIVE: D-Dimer, Quant: 0.92 mcg/mL FEU — ABNORMAL HIGH (ref <0.50)

## 2022-07-08 MED ORDER — PREDNISONE 20 MG PO TABS: 20.0000 mg | ORAL_TABLET | Freq: Every day | ORAL | 0 refills | Status: AC

## 2022-07-08 MED ORDER — BENZONATATE 200 MG PO CAPS: 200.0000 mg | ORAL_CAPSULE | Freq: Two times a day (BID) | ORAL | 0 refills | Status: AC | PRN

## 2022-07-08 NOTE — Progress Notes (Signed)
Acute Office Visit  Subjective:     Patient ID: [name], female    DOB: 06/04/1971, 52 y.o.   MRN: [id number]  Chief Complaint  Patient presents with   Cough    X 1 week. L rib pain started yesterday 07/07/22. Denies any feverr.      Cough:  Patient states she has had a dry cough for the past 3 weeks. Reports she will occasionally take Mucinex and that will allow her to bring up some sputum. She has now started having some left sided rib pain from all of the coughing the past few days. States pain is worse with coughing, deep breaths, position changes, palpation. It can get up to 8/10 at times. She has been taking ibuprofen without much improvement and would like and xray. She denies any fevers chills, dyspnea, weight loss, rhinorrhea, headaches, hemoptysis.   All review of systems negative except what is listed in the HPI      Objective:    BP 107/68 (BP Location: Left Arm, Patient Position: Sitting, Cuff Size: Normal)   Pulse 79   Temp 97.7 F (36.5 C) (Oral)   Resp 18   Ht 5' (1.524 m)   Wt 131 lb 9.6 oz (59.7 kg)   SpO2 100%   BMI 25.70 kg/m    Physical Exam Vitals reviewed.  Constitutional:      Appearance: Normal appearance.  Cardiovascular:     Rate and Rhythm: Normal rate and regular rhythm.     Pulses: Normal pulses.     Heart sounds: Normal heart sounds.  Pulmonary:     Effort: Pulmonary effort is normal.     Breath sounds: Normal breath sounds.  Musculoskeletal:        General: Normal range of motion.     Cervical back: Normal range of motion and neck supple.     Comments: Tender to palpation of left lateral ribs  Skin:    General: Skin is warm and dry.  Neurological:     Mental Status: She is alert and oriented to person, place, and time.  Psychiatric:        Mood and Affect: Mood normal.        Behavior: Behavior normal.        Thought Content: Thought content normal.        Judgment: Judgment normal.     No results found for any visits  on 07/08/22.      Assessment & Plan:   Problem List Items Addressed This Visit   None Visit Diagnoses     Acute cough    -  Primary   Relevant Medications   benzonatate (TESSALON) 200 MG capsule   predniSONE (DELTASONE) 20 MG tablet   Other Relevant Orders   CBC   D-Dimer, Quantitative   DG Chest 2 View   Rib pain on left side       Relevant Medications   predniSONE (DELTASONE) 20 MG tablet   Other Relevant Orders   CBC   D-Dimer, Quantitative      Prednisone for possible costochondritis. Giving low dose so we don't cause too high blood sugars. Do NOT take with any other antiinflammatories (ibuprofen, Aleve, etc. ). Tylenol is fine to take as needed. Blood work and chest xray today, we will let you know results and any changes to plan.  Adding some cough medicine for you.   Patient aware of signs/symptoms requiring further/urgent evaluation.    Meds ordered this encounter  Medications   benzonatate (TESSALON) 200 MG capsule    Sig: Take 1 capsule (200 mg total) by mouth 2 (two) times daily as needed for cough.    Dispense:  20 capsule    Refill:  0    Order Specific Question:   Supervising Provider    Answer:   Penni Homans A [[id number]]   predniSONE (DELTASONE) 20 MG tablet    Sig: Take 1 tablet (20 mg total) by mouth daily with breakfast for 5 days.    Dispense:  5 tablet    Refill:  0    Order Specific Question:   Supervising Provider    Answer:   Penni Homans A [[id number]]    Return if symptoms worsen or fail to improve.  Terrilyn Saver, NP

## 2022-07-08 NOTE — Patient Instructions (Addendum)
Prednisone for possible costochondritis. Giving low dose so we don't cause too high blood sugars. Do NOT take with any other antiinflammatories (ibuprofen, Aleve, etc. ). Tylenol is fine to take as needed. Blood work and chest xray today, we will let you know results and any changes to plan.  Adding some cough medicine for you.

## 2022-07-08 NOTE — Telephone Encounter (Signed)
Disregard

## 2022-07-09 ENCOUNTER — Encounter: Payer: Self-pay | Admitting: Family Medicine

## 2022-07-09 NOTE — Addendum Note (Signed)
Addended by: Caleen Jobs B on: 07/09/2022 08:09 AM   Modules accepted: Orders

## 2022-07-09 NOTE — Progress Notes (Deleted)
Name: [name]  MRN/ DOB: [id number], 1971/02/20   Age/ Sex: 52 y.o., female    PCP: Terrilyn Saver, NP   Reason for Endocrinology Evaluation: Type 1 Diabetes Mellitus     Date of Initial Endocrinology Visit: 08/22/2021    PATIENT IDENTIFIER: [name] is a 52 y.o. female with a past medical history of T1DM. The patient presented for initial endocrinology clinic visit on 08/22/2021 for consultative assistance with her diabetes management.    HPI: [name] was    Diagnosed with DM in 2010  Hemoglobin A1c has ranged from 8.5% in 2021, peaking at 10.7% in 2017.  Transferred care from [facility], was last seen there in 2021     SUBJECTIVE:   During the last visit (08/22/2021): ***  Today (07/09/22): [name] is here for follow-up on diabetes management.  She checks her blood sugars *** times daily. The patient has *** had hypoglycemic episodes since the last clinic visit, which typically occur *** x / - most often occuring ***. The patient is *** symptomatic with these episodes, with symptoms of {symptoms; hypoglycemia:9084048}.   She has burning of the feet-intolerant to higher doses of gabapentin She was seen by her PCP yesterday for acute cough, was prescribed prednisone for left rib pain    She had an ED visit for urolithiasis in May 2023 She was also noted with hypercalcemia requiring ED visit, which was attributed to vitamin D intake, she was given pamidronate and calcitonin with improvement of calcium levels She was also noted with innumerable liver lesions and splenic lesions, questionable sarcoidosis, so pulmonology  HOME DIABETES REGIMEN: Basaglar   54 units daily in AM  Novolog 20 units with each meal      Statin: no ACE-I/ARB: yes  METER DOWNLOAD SUMMARY: Did not bring  DIABETIC COMPLICATIONS: Microvascular complications:  Neuropathy Denies: CKD, retinopathy  Last eye exam: Completed 2021  Macrovascular complications:   Denies: CAD,  PVD, CVA   PAST HISTORY: Past Medical History:  Past Medical History:  Diagnosis Date   Type 2 diabetes mellitus ([place])    Past Surgical History:  Past Surgical History:  Procedure Laterality Date   CESAREAN SECTION  03/05/2012   Procedure: CESAREAN SECTION;  Surgeon: Avel Sensor, MD;  Location: [facility];  Service: Obstetrics;  Laterality: N/A;    Social History:  reports that she has never smoked. She does not have any smokeless tobacco history on file. She reports that she does not drink alcohol and does not use drugs. Family History: No family history on file.   HOME MEDICATIONS: Allergies as of 07/10/2022   No Known Allergies      Medication List        Accurate as of July 09, 2022 12:56 PM. If you have any questions, ask your nurse or doctor.          Basaglar KwikPen 100 UNIT/ML Inject 54 Units into the skin daily.   benzonatate 200 MG capsule Commonly known as: TESSALON Take 1 capsule (200 mg total) by mouth 2 (two) times daily as needed for cough.   Contour Next Test test strip Generic drug: glucose blood 4 (four) times daily.   FreeStyle Libre 2 Reader Devi 1 Device by Does not apply route as directed.   FreeStyle Libre 3 Sensor Misc 1 each by Does not apply route every 14 (fourteen) days. Place 1 sensor on the skin every 14 days. Use to check glucose continuously   HumaLOG KwikPen 200  UNIT/ML KwikPen Generic drug: insulin lispro Max daily 90 units   ibuprofen 600 MG tablet Commonly known as: ADVIL Take 1 tablet (600 mg total) by mouth every 6 (six) hours as needed.   Insulin Pen Needle 32G X 4 MM Misc 1 Device by Does not apply route in the morning, at noon, in the evening, and at bedtime.   Pen Needles 31G X 5 MM Misc Use to check blood sugar 4 times daily   Iron (Ferrous Sulfate) 325 (65 Fe) MG Tabs Take 325 mg by mouth daily.   lisinopril 20 MG tablet Commonly known as: ZESTRIL Take 1 tablet (20 mg total) by mouth daily.    medroxyPROGESTERone 10 MG tablet Commonly known as: Provera Take 1 tablet (10 mg total) by mouth daily for 10 days.   omeprazole 20 MG capsule Commonly known as: PRILOSEC Take 20 mg by mouth daily.   oxyCODONE 5 MG immediate release tablet Commonly known as: Roxicodone Take 1 tablet (5 mg total) by mouth every 6 (six) hours as needed for severe pain.   predniSONE 20 MG tablet Commonly known as: DELTASONE Take 1 tablet (20 mg total) by mouth daily with breakfast for 5 days.   tamsulosin 0.4 MG Caps capsule Commonly known as: Flomax Take 1 capsule (0.4 mg total) by mouth daily.         ALLERGIES: No Known Allergies   REVIEW OF SYSTEMS: A comprehensive ROS was conducted with the patient and is negative except as per HPI and below:  ROS    OBJECTIVE:   VITAL SIGNS: There were no vitals taken for this visit.   PHYSICAL EXAM:  General: Pt appears well and is in NAD  Hydration: Well-hydrated with moist mucous membranes and good skin turgor  HEENT: Head: Unremarkable with good dentition. Oropharynx clear without exudate.  Eyes: External eye exam normal without stare, lid lag or exophthalmos.  EOM intact.  PERRL.  Neck: General: Supple without adenopathy or carotid bruits. Thyroid: Thyroid size normal.  No goiter or nodules appreciated. No thyroid bruit.  Lungs: Clear with good BS bilat with no rales, rhonchi, or wheezes  Heart: RRR with normal S1 and S2 and no gallops; no murmurs; no rub  Abdomen: Normoactive bowel sounds, soft, nontender, without masses or organomegaly palpable  Extremities:  Lower extremities - No pretibial edema. No lesions.  Skin: Normal texture and temperature to palpation. No rash noted. No Acanthosis nigricans/skin tags. No lipohypertrophy.  Neuro: MS is good with appropriate affect, pt is alert and Ox3    DM foot exam: 08/22/2021  The skin of the feet is intact without sores or ulcerations. The pedal pulses are 2+ on right and 2+ on  left. The sensation is intact to a screening 5.07, 10 gram monofilament bilaterally   DATA REVIEWED:   Latest Reference Range & Units 08/22/21 09:25  Sodium 135 - 145 mEq/L 135  Potassium 3.5 - 5.1 mEq/L 4.6  Chloride 96 - 112 mEq/L 100  CO2 19 - 32 mEq/L 30  Glucose 70 - 99 mg/dL 180 (H)  BUN 6 - 23 mg/dL 17  Creatinine 0.40 - 1.20 mg/dL 0.75  Calcium 8.4 - 10.5 mg/dL 9.7  GFR >60.00 mL/min 92.53  Total CHOL/HDL Ratio  4  Cholesterol 0 - 200 mg/dL 167  HDL Cholesterol >39.00 mg/dL 38.50 (L)  LDL (calc) 0 - 99 mg/dL 102 (H)  MICROALB/CREAT RATIO 0.0 - 30.0 mg/g 1.7  NonHDL  128.47  Triglycerides 0.0 - 149.0 mg/dL 131.0  VLDL 0.0 - 40.0 mg/dL 26.2    Latest Reference Range & Units 08/22/21 09:25  VITD 30.00 - 100.00 ng/mL 24.73 (L)     Latest Reference Range & Units 08/22/21 09:25  TSH 0.35 - 5.50 uIU/mL 1.03  T4,Free(Direct) 0.60 - 1.60 ng/dL 0.90    Latest Reference Range & Units 08/22/21 09:25  Creatinine,U mg/dL 41.3  Microalb, Ur 0.0 - 1.9 mg/dL <0.7  MICROALB/CREAT RATIO 0.0 - 30.0 mg/g 1.7     ASSESSMENT / PLAN / RECOMMENDATIONS:   1) Type 1 Diabetes Mellitus, Poorly controlled, With neuropathic complications - Most recent A1c of 8.2 %. Goal A1c < 7.0 %.    Plan: GENERAL: I have discussed with the patient the pathophysiology of diabetes. We went over the natural progression of the disease. I explained the complications associated with diabetes including retinopathy, nephropathy, neuropathy as well as increased risk of cardiovascular disease. We went over the benefit seen with glycemic control.   I explained to the patient that diabetic patients are at higher than normal risk for amputations.  She was unable to understand the concept of carb counting  Due to reported hypoglycemia around 2 AM , no data available for me I am going to reduce Toujeo No changes to her prandial dose of insulin again due to lack of glucose data She will be provided with a correction  scale Freestyle Libre 2's sent to the pharmacy  MEDICATIONS: Decrease Toujeo 46 units daily Continue NovoLog 20 units with each meal Correction factor: NovoLog(BG -130/30)  EDUCATION / INSTRUCTIONS: BG monitoring instructions: Patient is instructed to check her blood sugars 3 times a day, before meals . Call [facility] if: BG persistently < 70  I reviewed the Rule of 15 for the treatment of hypoglycemia in detail with the patient. Literature supplied.   2) Diabetic complications:  Eye: Does not have known diabetic retinopathy.  Neuro/ Feet: Does  have known diabetic peripheral neuropathy. Renal: Patient does not have known baseline CKD. She is  on an ACEI/ARB at present.  3) Dyslipidemia:   -LDL above goal at 102 mg/DL.  We will emphasize importance of low-fat diet and will discuss statin therapy on the next visit   4) vitamin D insufficiency:  -Patient will be advised to start OTC vitamin D3 at 2000 IUs daily  5) Peripheral Neuropathy:  -Patient states she is on small dose of gabapentin and she continues to wake up at night with leg pains -We will increase gabapentin, cautioned against drowsiness    Medication Stop gabapentin 100 mg daily Start gabapentin 300 mg nightly   Follow-up in 3 months  Signed electronically by: Mack Guise, MD  [facility]  [address] [place], [place] [address] Phone: [phone] FAX: [phone]   CC: Terrilyn Saver, NP [address] [place] [address] Phone: [phone]  Fax: [phone]    Return to Endocrinology clinic as below: Future Appointments  Date Time Provider [place]  07/10/2022  9:30 AM Latesha Chesney, Melanie Crazier, MD [facility] None  07/30/2022 10:15 AM Margaretha Seeds, MD [facility]

## 2022-07-10 ENCOUNTER — Ambulatory Visit (HOSPITAL_BASED_OUTPATIENT_CLINIC_OR_DEPARTMENT_OTHER): Payer: BLUE CROSS/BLUE SHIELD

## 2022-07-10 ENCOUNTER — Encounter: Payer: Self-pay | Admitting: Internal Medicine

## 2022-07-10 ENCOUNTER — Ambulatory Visit: Payer: Commercial Managed Care - HMO | Admitting: Internal Medicine

## 2022-07-10 NOTE — Addendum Note (Signed)
Addended by: Caleen Jobs B on: 07/10/2022 10:14 AM   Modules accepted: Orders

## 2022-07-10 NOTE — Telephone Encounter (Signed)
Pt's daughter is calling to get CT sent to another dept that is in network.   [facility] [address] , [place], [place] [address]

## 2022-07-12 NOTE — Telephone Encounter (Signed)
I was able to speak with patient on 3rd call attempt today. States she is feeling better and may wait until Monday. Discussed the significance of getting the CT scan. She will talk to her daughter and call [facility]. Advised her to go to ED given elevated d-dimer and symptoms since it is the weekend.  Patient voiced understanding.

## 2022-07-15 ENCOUNTER — Telehealth: Payer: Self-pay | Admitting: Family Medicine

## 2022-07-15 NOTE — Telephone Encounter (Signed)
[name] (son DPR OK) called stating that [facility] had not received the CT order we sent to them and needed to have it resent when possible. Pt would like a message sent to indicate this has been done.

## 2022-07-17 NOTE — Telephone Encounter (Signed)
Pt's son called back, stated he spoke with [facility] and they did receive the order but are still needing the ins auth. He would like a call back from [place].

## 2022-07-22 ENCOUNTER — Telehealth: Payer: Self-pay

## 2022-07-22 NOTE — Telephone Encounter (Signed)
Transition Care Management Unsuccessful Follow-up Telephone Call  Date of discharge and from where:  [facility], ER  07/18/2022  Attempts:  1st Attempt  Reason for unsuccessful TCM follow-up call:  No answer/busy

## 2022-07-23 ENCOUNTER — Encounter: Payer: Self-pay | Admitting: Family Medicine

## 2022-07-23 NOTE — Telephone Encounter (Signed)
Transition Care Management Unsuccessful Follow-up Telephone Call  Date of discharge and from where:  [facility], ER 07/18/2022  Attempts:  2nd Attempt  Reason for unsuccessful TCM follow-up call:  No answer/busy

## 2022-07-24 NOTE — Telephone Encounter (Signed)
Transition Care Management Unsuccessful Follow-up Telephone Call  Date of discharge and from where:  [facility] ER, 07/18/2022  Attempts:  3rd Attempt  Reason for unsuccessful TCM follow-up call:  No answer/busy

## 2022-07-30 ENCOUNTER — Ambulatory Visit (HOSPITAL_BASED_OUTPATIENT_CLINIC_OR_DEPARTMENT_OTHER): Payer: BLUE CROSS/BLUE SHIELD | Admitting: Pulmonary Disease

## 2022-07-30 NOTE — Progress Notes (Incomplete)
Subjective:   PATIENT ID: [name] GENDER: female DOB: [birth year], MRN: [id number]  No chief complaint on file.   Reason for Visit: New patient for sarcoid  [name] is a 52 year old female   *** Social History:  Environmental exposures: ***  I have personally reviewed patient's past medical/family/social history, allergies, current medications.***  Past Medical History:  Diagnosis Date  . Type 2 diabetes mellitus (HCC)      No family history on file.   Social History   Occupational History  . Not on file  Tobacco Use  . Smoking status: Never  . Smokeless tobacco: Not on file  Substance and Sexual Activity  . Alcohol use: No  . Drug use: No  . Sexual activity: Yes    No Known Allergies   Outpatient Medications Prior to Visit  Medication Sig Dispense Refill  . benzonatate (TESSALON) 200 MG capsule Take 1 capsule (200 mg total) by mouth 2 (two) times daily as needed for cough. 20 capsule 0  . Continuous Blood Gluc Receiver (FREESTYLE LIBRE 2 READER) DEVI 1 Device by Does not apply route as directed. 1 each 0  . Continuous Blood Gluc Sensor (FREESTYLE LIBRE 3 SENSOR) MISC 1 each by Does not apply route every 14 (fourteen) days. Place 1 sensor on the skin every 14 days. Use to check glucose continuously 2 each 11  . glucose blood (CONTOUR NEXT TEST) test strip 4 (four) times daily.    Marland Kitchen ibuprofen (ADVIL) 600 MG tablet Take 1 tablet (600 mg total) by mouth every 6 (six) hours as needed. 30 tablet 0  . Insulin Glargine (BASAGLAR KWIKPEN) 100 UNIT/ML Inject 54 Units into the skin daily. 45 mL 6  . insulin lispro (HUMALOG KWIKPEN) 200 UNIT/ML KwikPen Max daily 90 units 30 mL 3  . Insulin Pen Needle (PEN NEEDLES) 31G X 5 MM MISC Use to check blood sugar 4 times daily 400 each 3  . Insulin Pen Needle 32G X 4 MM MISC 1 Device by Does not apply route in the morning, at noon, in the evening, and at bedtime. 400 each 3  . Iron, Ferrous Sulfate, 325 (65 Fe) MG TABS  Take 325 mg by mouth daily. 30 tablet 3  . lisinopril (ZESTRIL) 20 MG tablet Take 1 tablet (20 mg total) by mouth daily. 90 tablet 3  . medroxyPROGESTERone (PROVERA) 10 MG tablet Take 1 tablet (10 mg total) by mouth daily for 10 days. 10 tablet 0  . omeprazole (PRILOSEC) 20 MG capsule Take 20 mg by mouth daily.    Marland Kitchen oxyCODONE (ROXICODONE) 5 MG immediate release tablet Take 1 tablet (5 mg total) by mouth every 6 (six) hours as needed for severe pain. 12 tablet 0  . tamsulosin (FLOMAX) 0.4 MG CAPS capsule Take 1 capsule (0.4 mg total) by mouth daily. 20 capsule 0   No facility-administered medications prior to visit.    ROS   Objective:  There were no vitals filed for this visit.    Physical Exam: General: Well-appearing, no acute distress HENT: [place], AT Eyes: EOMI, no scleral icterus Respiratory: ***Clear to auscultation bilaterally.  No crackles, wheezing or rales Cardiovascular: RRR, -M/R/G, no JVD Extremities:-Edema,-tenderness Neuro: AAO x4, CNII-XII grossly intact Psych: Normal mood, normal affect  Data Reviewed:  Imaging:  PFT:  Labs:      Assessment & Plan:   Discussion: HPI  ***  Health Maintenance Immunization History  Administered Date(s) Administered  . PFIZER(Purple Top)SARS-COV-2 Vaccination 09/16/2019, 10/07/2019  .  Pneumococcal Polysaccharide-23 03/08/2012  . Tdap 03/07/2012   CT Lung Screen***  No orders of the defined types were placed in this encounter. No orders of the defined types were placed in this encounter.   No follow-ups on file.  I have spent a total time of***-minutes on the day of the appointment reviewing prior documentation, coordinating care and discussing medical diagnosis and plan with the patient/family. Imaging, labs and tests included in this note have been reviewed and interpreted independently by me.  [place], MD [facility] 07/30/2022 9:41 AM  Office Number [phone]

## 2022-11-06 ENCOUNTER — Other Ambulatory Visit (HOSPITAL_COMMUNITY): Payer: Self-pay

## 2022-11-21 ENCOUNTER — Other Ambulatory Visit (HOSPITAL_COMMUNITY): Payer: Self-pay

## 2023-04-19 IMAGING — CT CT RENAL STONE PROTOCOL
2 of 4 series · 14 of 46 positions shown, 16 images · non-contrast
Comparison: [facility] 10/07/2021; CT abdomen and pelvis 10/02/2021

CLINICAL DATA: Flank pain. Kidney stones suspected. Right flank
pain radiating to the right abdomen since this morning. Pain in
lower abdomen 2 days prior.



[Series 3: axial st · axial · 0.80mm/px · z∈[-643,-268]mm · 11 of 91 slices shown, 13 images]
[im 8/91  soft-tissue]
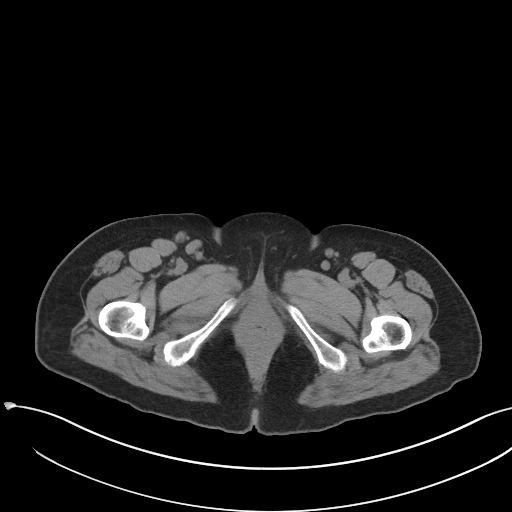
[im 8/91  bone]
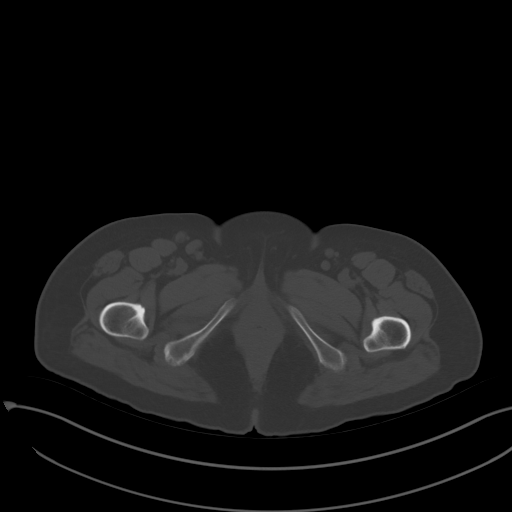
[im 16/91  soft-tissue]
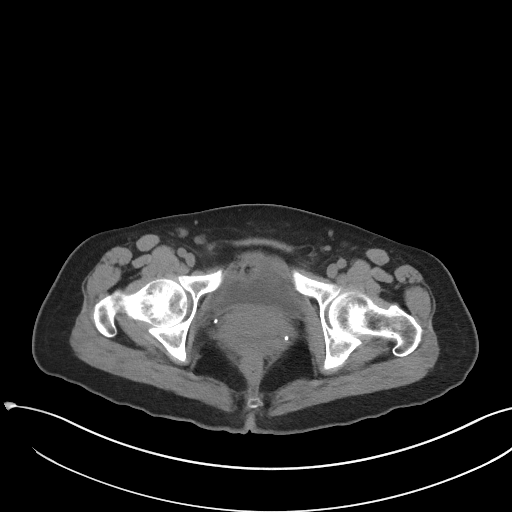
[im 23/91  soft-tissue]
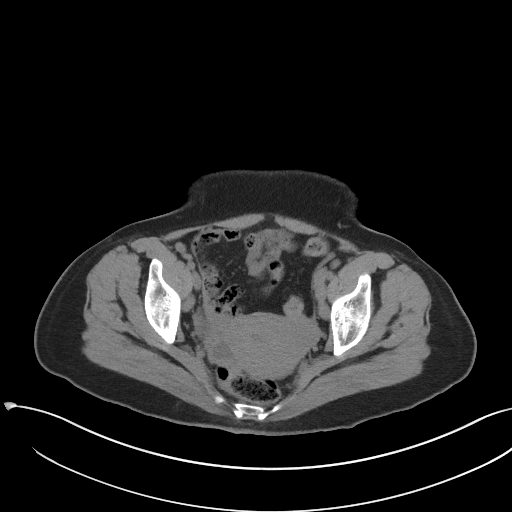
[im 31/91  soft-tissue]
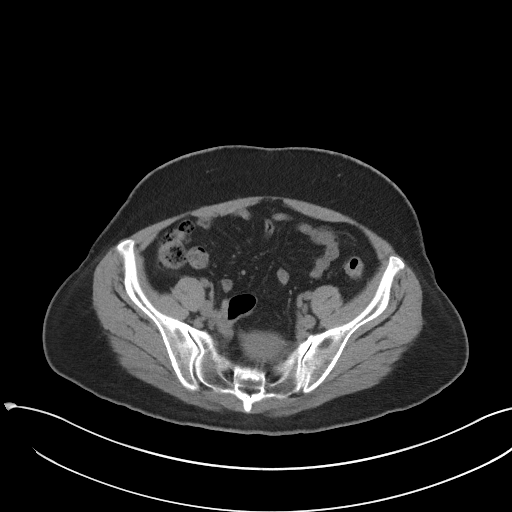
[im 38/91  soft-tissue]
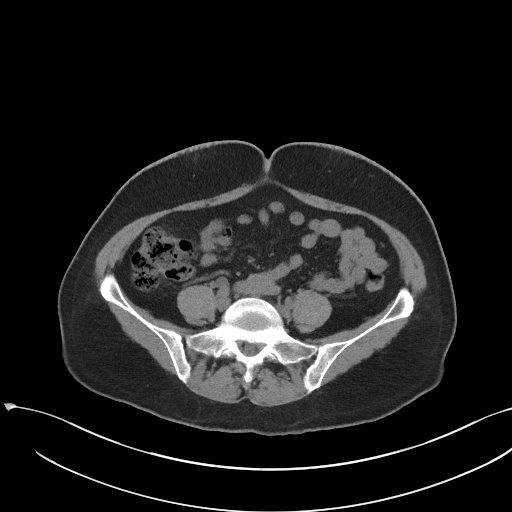
[im 46/91  soft-tissue]
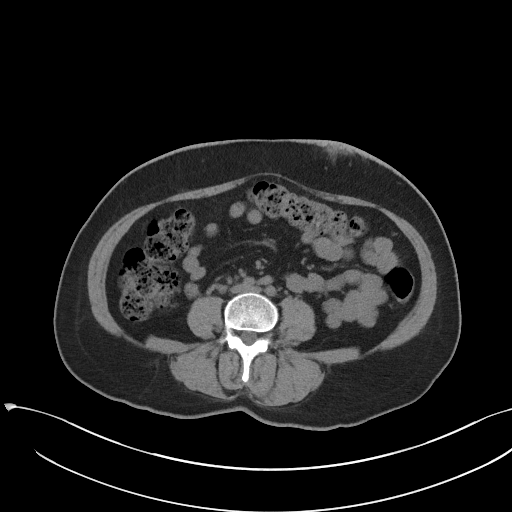
[im 53/91  soft-tissue]
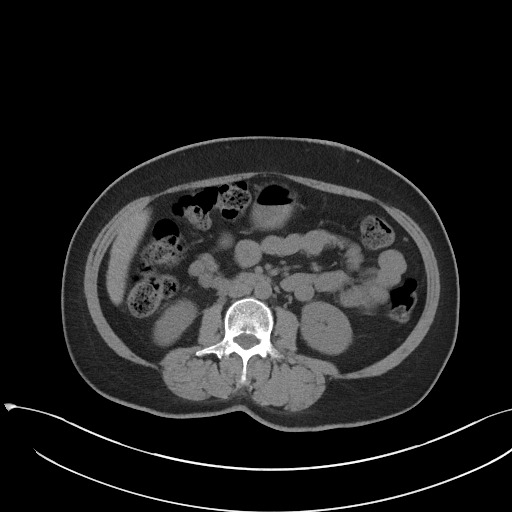
[im 61/91  soft-tissue]
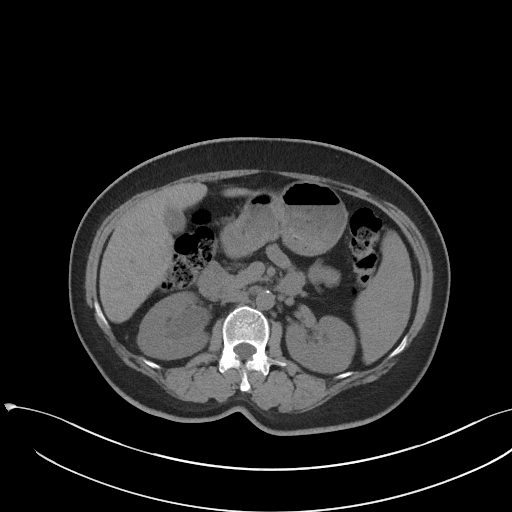
[im 68/91  soft-tissue]
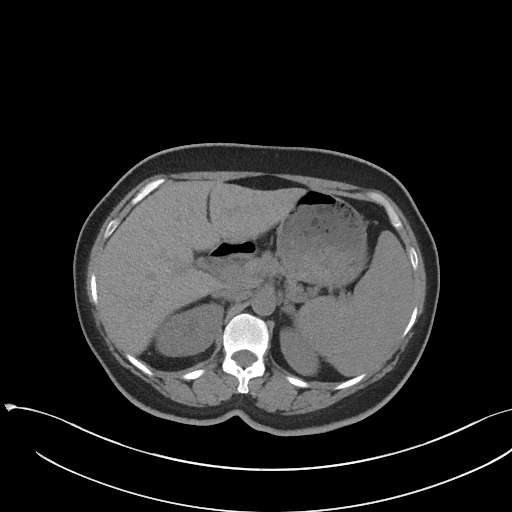
[im 68/91  bone]
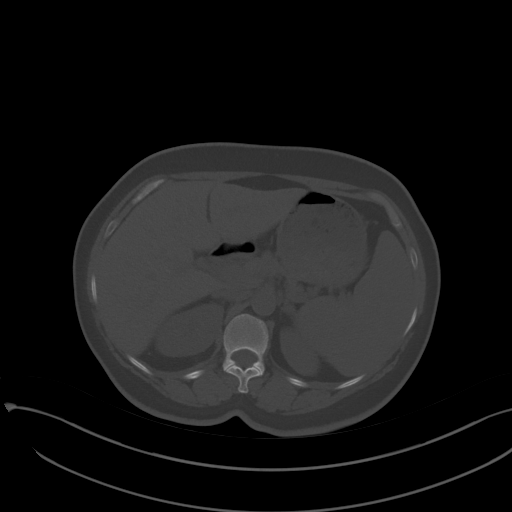
[im 76/91  soft-tissue]
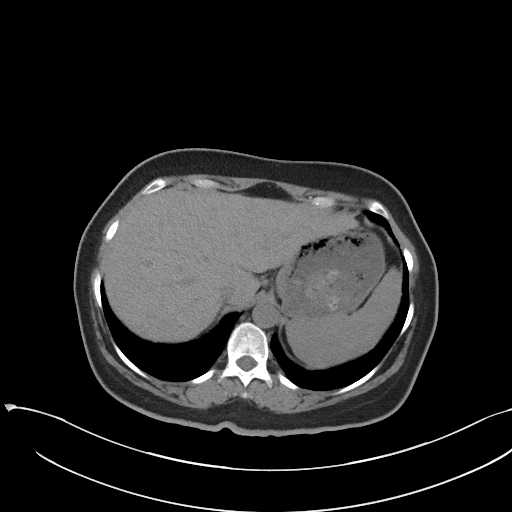
[im 83/91  soft-tissue]
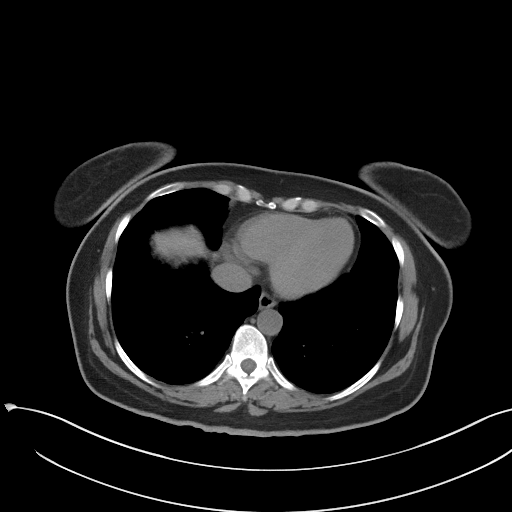

[Series 6: coronal st · coronal · 0.85mm/px · 3 of 96 slices shown]
[im 32/96  soft-tissue]
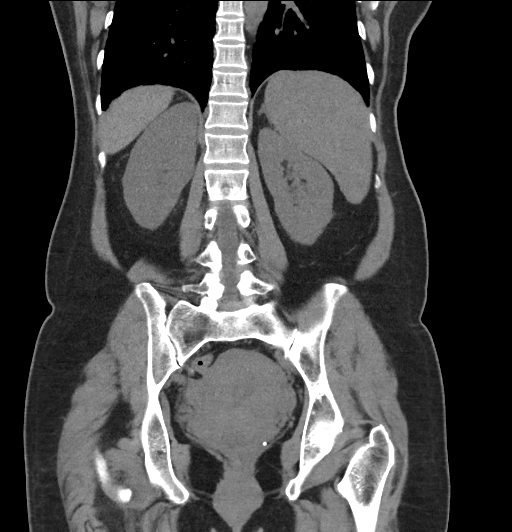
[im 43/96  soft-tissue]
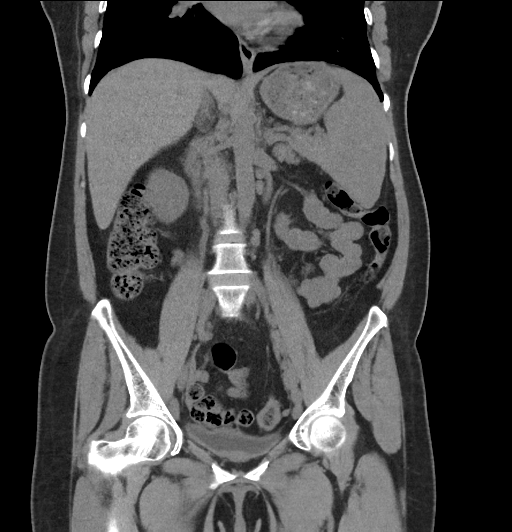
[im 53/96  soft-tissue]
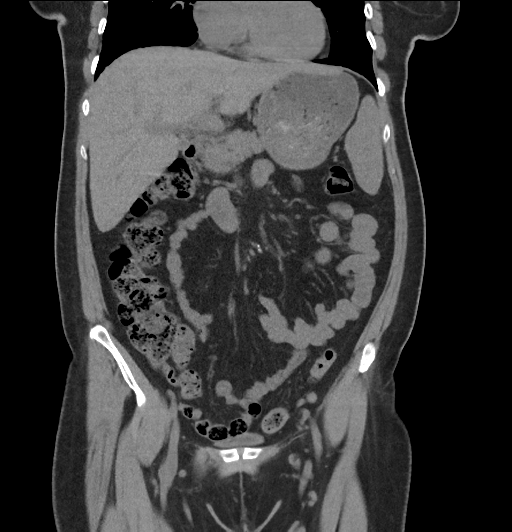

[14 of 46 positions shown; findings below may reference images not displayed]

FINDINGS: Lower chest: There again is mild interstitial thickening within the
partially visualized right middle lobe and lower lobe with mild
interstitial scarring adjacent to the right major fissure. No
pleural effusion.

Hepatobiliary: Smooth liver contours. No gross liver lesion. The
gallbladder is grossly unremarkable.

Pancreas: No gross abnormality.

Spleen: Diffuse mild heterogeneity corresponding to the numerous
hypodense lesions better seen on prior contrast CT. There are again
enlarged lymph nodes including small splenule again seen medial to
the splenic hilum.

Adrenals/Urinary Tract: Normal adrenals. There is a new 3 mm stone
within the distal right ureter (axial series 3, image 71) with
moderate upstream ureterectasis and mild-to-moderate right
hydronephrosis. The right kidney is mildly edematous and there is
minimal right perinephric fat stranding. No left hydronephrosis. No
stone is seen within either kidney. Within the limitation of lack of
IV contrast, no contour deforming renal mass is seen.

Stomach/Bowel: Normal appendix. Mild fecal material within the
distal ileum suggesting chronic slow bowel transit. No dilated loops
of bowel are seen to indicate bowel obstruction.

Vascular/Lymphatic: No abdominal aortic aneurysm. Minimal
atherosclerotic calcifications. Previously on the contrasted CT
multiple mildly enlarged right upper quadrant lymph nodes are seen
including aortocaval 1.2 cm and periportal 1.2 cm lymph nodes. These
appear stable to mildly decreased in size from prior, however
evaluation of the current noncontrast study is limited.
Subcentimeter short axis para-aortic lymph nodes are again seen.

Left external iliac chain 1.1 cm short axis (axial series 3, image
65) appears similar to prior. No mesenteric pathologically enlarged
lymph nodes are seen. There are again enlarged bilateral inguinal
lymph nodes. Reference left inguinal 1.1 cm short axis lymph node is
unchanged (axial image 87). A partially visualized more inferior
left 1.5 cm and partially visualized more inferior right 1.3 cm
short axis lymph nodes were not previously imaged due to a shorter
scan length previously. A more superior right inguinal 9 mm short
axis lymph node (axial image 85) is similar to prior.

Reproductive: The uterus is present. No gross adnexal abnormality is
identified. There is mild pelvic vascular congestion as better seen
on prior contrast CT.

Other: There is mild interval decrease in size of the previously
seen left anterior abdominal wall subcutaneous fat superficial fluid
density now measuring up to 2.5 x 1.1 x 3.8 cm compared to 2.8 x
x 4.2 cm previously. Mild surrounding inflammatory fat stranding.
Again this is nonspecific but may represent a prior injection site.
Resolution of the prior more lateral left anterior abdominal wall
subcutaneous fat subcutaneous air likely from prior injection.

No free air or free fluid is seen within the abdomen or pelvis.

Musculoskeletal: Minimal dextrocurvature centered within the upper
lumbar spine. No acute skeletal abnormality.
IMPRESSION: Compared to 10/02/2021:

1. There is a 3 mm distal right ureteral stone with moderate
upstream right ureterectasis and mild-to-moderate right
hydronephrosis.
2. On the prior contrasted CT 10/02/2021 there are innumerable
low-density lesions within the spleen and liver. These are less well
visualized on the current noncontrast CT but likely still present.
3. There are again mildly enlarged retroperitoneal and pelvic lymph
node. No bulky or encasing lymphadenopathy.
4. Minimal interval decrease in size of left anterior abdominal wall
superficial subcutaneous fat density that may represent a prior
injection site.

## 2023-06-09 ENCOUNTER — Other Ambulatory Visit: Payer: Self-pay | Admitting: Internal Medicine

## 2023-06-09 DIAGNOSIS — E1065 Type 1 diabetes mellitus with hyperglycemia: Secondary | ICD-10-CM

## 2024-02-01 NOTE — Progress Notes (Deleted)
 Lab visit only.

## 2024-02-01 NOTE — Progress Notes (Unsigned)
 This encounter was created in error - please disregard.
